# Patient Record
Sex: Female | Born: 1940 | Race: Black or African American | Hispanic: No | Marital: Single | Smoking: Never smoker
Health system: Southern US, Community
[De-identification: ages and names within clinical notes are randomized; demographics above are authoritative.]

## PROBLEM LIST (undated history)

## (undated) DIAGNOSIS — I2510 Atherosclerotic heart disease of native coronary artery without angina pectoris: Secondary | ICD-10-CM

## (undated) DIAGNOSIS — I4719 Other supraventricular tachycardia: Secondary | ICD-10-CM

## (undated) DIAGNOSIS — I509 Heart failure, unspecified: Secondary | ICD-10-CM

## (undated) DIAGNOSIS — N183 Chronic kidney disease, stage 3 unspecified: Secondary | ICD-10-CM

## (undated) DIAGNOSIS — K6289 Other specified diseases of anus and rectum: Secondary | ICD-10-CM

## (undated) DIAGNOSIS — I10 Essential (primary) hypertension: Secondary | ICD-10-CM

## (undated) DIAGNOSIS — D125 Benign neoplasm of sigmoid colon: Secondary | ICD-10-CM

## (undated) DIAGNOSIS — E7800 Pure hypercholesterolemia, unspecified: Secondary | ICD-10-CM

## (undated) DIAGNOSIS — D122 Benign neoplasm of ascending colon: Secondary | ICD-10-CM

## (undated) DIAGNOSIS — I471 Supraventricular tachycardia: Secondary | ICD-10-CM

## (undated) DIAGNOSIS — D123 Benign neoplasm of transverse colon: Secondary | ICD-10-CM

## (undated) DIAGNOSIS — E119 Type 2 diabetes mellitus without complications: Secondary | ICD-10-CM

## (undated) DIAGNOSIS — R0681 Apnea, not elsewhere classified: Secondary | ICD-10-CM

## (undated) HISTORY — DX: Other specified diseases of anus and rectum: K62.89

## (undated) HISTORY — PX: ABDOMINAL HYSTERECTOMY: SHX81

## (undated) HISTORY — DX: Benign neoplasm of sigmoid colon: D12.5

## (undated) HISTORY — PX: PACEMAKER IMPLANT: EP1218

## (undated) HISTORY — DX: Benign neoplasm of ascending colon: D12.2

## (undated) HISTORY — DX: Benign neoplasm of transverse colon: D12.3

---

## 2017-07-26 ENCOUNTER — Encounter (HOSPITAL_COMMUNITY): Payer: Self-pay | Admitting: Emergency Medicine

## 2017-07-26 ENCOUNTER — Emergency Department (HOSPITAL_COMMUNITY): Payer: Medicare Other

## 2017-07-26 ENCOUNTER — Other Ambulatory Visit: Payer: Self-pay

## 2017-07-26 DIAGNOSIS — I10 Essential (primary) hypertension: Secondary | ICD-10-CM | POA: Diagnosis not present

## 2017-07-26 DIAGNOSIS — J189 Pneumonia, unspecified organism: Secondary | ICD-10-CM | POA: Insufficient documentation

## 2017-07-26 DIAGNOSIS — E119 Type 2 diabetes mellitus without complications: Secondary | ICD-10-CM | POA: Diagnosis not present

## 2017-07-26 DIAGNOSIS — R05 Cough: Secondary | ICD-10-CM | POA: Diagnosis present

## 2017-07-26 LAB — CBC
HCT: 38.9 % (ref 36.0–46.0)
HEMOGLOBIN: 12.7 g/dL (ref 12.0–15.0)
MCH: 27.4 pg (ref 26.0–34.0)
MCHC: 32.6 g/dL (ref 30.0–36.0)
MCV: 84 fL (ref 78.0–100.0)
PLATELETS: 194 10*3/uL (ref 150–400)
RBC: 4.63 MIL/uL (ref 3.87–5.11)
RDW: 17 % — AB (ref 11.5–15.5)
WBC: 11.7 10*3/uL — ABNORMAL HIGH (ref 4.0–10.5)

## 2017-07-26 LAB — I-STAT TROPONIN, ED: TROPONIN I, POC: 0 ng/mL (ref 0.00–0.08)

## 2017-07-26 LAB — BASIC METABOLIC PANEL
Anion gap: 9 (ref 5–15)
BUN: 23 mg/dL — AB (ref 6–20)
CALCIUM: 9.1 mg/dL (ref 8.9–10.3)
CO2: 26 mmol/L (ref 22–32)
CREATININE: 1.41 mg/dL — AB (ref 0.44–1.00)
Chloride: 102 mmol/L (ref 101–111)
GFR calc Af Amer: 41 mL/min — ABNORMAL LOW (ref >60)
GFR, EST NON AFRICAN AMERICAN: 35 mL/min — AB (ref >60)
GLUCOSE: 151 mg/dL — AB (ref 65–99)
Potassium: 3.5 mmol/L (ref 3.5–5.1)
Sodium: 137 mmol/L (ref 135–145)

## 2017-07-26 NOTE — ED Triage Notes (Signed)
Reports URI symptoms since yesterday along with productive cough.  Also c/o chest pain across center of chest.

## 2017-07-27 ENCOUNTER — Emergency Department (HOSPITAL_COMMUNITY)
Admission: EM | Admit: 2017-07-27 | Discharge: 2017-07-27 | Disposition: A | Discharge status: Home / Self Care | Payer: Medicare Other | Attending: Emergency Medicine | Admitting: Emergency Medicine

## 2017-07-27 DIAGNOSIS — J189 Pneumonia, unspecified organism: Secondary | ICD-10-CM | POA: Diagnosis not present

## 2017-07-27 HISTORY — DX: Essential (primary) hypertension: I10

## 2017-07-27 HISTORY — DX: Apnea, not elsewhere classified: R06.81

## 2017-07-27 HISTORY — DX: Pure hypercholesterolemia, unspecified: E78.00

## 2017-07-27 HISTORY — DX: Type 2 diabetes mellitus without complications: E11.9

## 2017-07-27 LAB — INFLUENZA PANEL BY PCR (TYPE A & B)
INFLAPCR: NEGATIVE
Influenza B By PCR: NEGATIVE

## 2017-07-27 MED ORDER — BENZONATATE 100 MG PO CAPS: 100.0000 mg | ORAL_CAPSULE | Freq: Three times a day (TID) | ORAL | 0 refills | Status: DC

## 2017-07-27 MED ORDER — LEVOFLOXACIN 500 MG PO TABS: 500.0000 mg | ORAL_TABLET | Freq: Every day | ORAL | 0 refills | Status: DC

## 2017-07-27 MED ORDER — ACETAMINOPHEN 500 MG PO TABS
1000.0000 mg | ORAL_TABLET | Freq: Once | ORAL | Status: AC
  Administered 2017-07-27: 1000 mg via ORAL
  Filled 2017-07-27: qty 2

## 2017-07-27 MED ORDER — LEVOFLOXACIN 500 MG PO TABS
500.0000 mg | ORAL_TABLET | Freq: Once | ORAL | Status: AC
  Administered 2017-07-27: 500 mg via ORAL
  Filled 2017-07-27: qty 1

## 2017-07-27 NOTE — ED Provider Notes (Signed)
[facility] EMERGENCY DEPARTMENT Provider Note   CSN: [id number] Arrival date & time: 07/26/17  2145     History   Chief Complaint Chief Complaint  Patient presents with  . URI  . Chest Pain    HPI Candice Lynn is a 76 y.o. female.  HPI Patient is a 76 year old female presents to the emergency department with nasal congestion cough and chills over the past 24 hours.  She reports pain across her central chest.  This is been present for most of the day.  Denies pleuritic chest pain.  She does have a history of diabetes.  No documented fever at home.  Cough is productive.  No significant exertional shortness of breath.  Denies orthopnea.  No unilateral leg swelling.  Denies a history of DVT or pulmonary embolism.  No prior history of coronary artery disease.  Symptoms are mild to moderate in severity.   Past Medical History:  Diagnosis Date  . Apnea   . Diabetes mellitus without complication ([place])   . Hypercholesterolemia   . Hypertension     There are no active problems to display for this patient.   Past Surgical History:  Procedure Laterality Date  . PACEMAKER IMPLANT      OB History    No data available       Home Medications    Prior to Admission medications   Medication Sig Start Date End Date Taking? Authorizing Provider  benzonatate (TESSALON) 100 MG capsule Take 1 capsule (100 mg total) by mouth every 8 (eight) hours. 07/27/17   Jola Schmidt, MD  levofloxacin (LEVAQUIN) 500 MG tablet Take 1 tablet (500 mg total) by mouth daily. 07/27/17   Jola Schmidt, MD    Family History No family history on file.  Social History Social History   Tobacco Use  . Smoking status: Never Smoker  . Smokeless tobacco: Never Used  Substance Use Topics  . Alcohol use: No    Frequency: Never  . Drug use: No     Allergies   Patient has no known allergies.   Review of Systems Review of Systems  All other systems reviewed and are  negative.    Physical Exam Updated Vital Signs BP 125/60   Pulse 78   Temp 99 F (37.2 C) (Oral)   Resp (!) 23   Ht 5' (1.524 m)   Wt 89.8 kg (198 lb)   SpO2 98%   BMI 38.67 kg/m   Physical Exam  Constitutional: She is oriented to person, place, and time. She appears well-developed and well-nourished. No distress.  HENT:  Head: Normocephalic and atraumatic.  Eyes: EOM are normal.  Neck: Normal range of motion.  Cardiovascular: Normal rate, regular rhythm and normal heart sounds.  Pulmonary/Chest: Effort normal and breath sounds normal.  Abdominal: Soft. She exhibits no distension. There is no tenderness.  Musculoskeletal: Normal range of motion.  Neurological: She is alert and oriented to person, place, and time.  Skin: Skin is warm and dry.  Psychiatric: She has a normal mood and affect. Judgment normal.  Nursing note and vitals reviewed.    ED Treatments / Results  Labs (all labs ordered are listed, but only abnormal results are displayed) Labs Reviewed  BASIC METABOLIC PANEL - Abnormal; Notable for the following components:      Result Value   Glucose, Bld 151 (*)    BUN 23 (*)    Creatinine, Ser 1.41 (*)    GFR calc non Af Wyvonnia Lora  35 (*)    GFR calc Af Amer 41 (*)    All other components within normal limits  CBC - Abnormal; Notable for the following components:   WBC 11.7 (*)    RDW 17.0 (*)    All other components within normal limits  INFLUENZA PANEL BY PCR (TYPE A & B)  I-STAT TROPONIN, ED    EKG  EKG Interpretation  Date/Time:  Sunday July 26 2017 22:05:03 EST Ventricular Rate:  90 PR Interval:  200 QRS Duration: 64 QT Interval:  340 QTC Calculation: 415 R Axis:   43 Text Interpretation:  Normal sinus rhythm Cannot rule out Anterior infarct , age undetermined Abnormal ECG No old tracing to compare Confirmed by Karey Stucki ([id number]) on 07/27/2017 5:41:49 AM       Radiology Dg Chest 2 View  Result Date: 07/26/2017 CLINICAL DATA:  76 y/o   F; chest pain.  Cough and congestion. EXAM: CHEST  2 VIEW COMPARISON:  None. FINDINGS: Cardiomegaly in 2 lead pacemaker. Coarse reticular opacities and ill-defined left lower lobe infiltrate. No pleural effusion or pneumothorax. Mild multilevel degenerative changes of the spine. IMPRESSION: Coarse reticular opacities and left lower lobe infiltrate, probably acute bronchopneumonia. Cardiomegaly. Electronically Signed   By: Lance  Furusawa-Stratton M.D.   On: 07/26/2017 22:58    Procedures Procedures (including critical care time)  Medications Ordered in ED Medications  levofloxacin (LEVAQUIN) tablet 500 mg (500 mg Oral Given 07/27/17 0315)  acetaminophen (TYLENOL) tablet 1,000 mg (1,000 mg Oral Given 07/27/17 0308)     Initial Impression / Assessment and Plan / ED Course  I have reviewed the triage vital signs and the nursing notes.  Pertinent labs & imaging results that were available during my care of the patient were reviewed by me and considered in my medical decision making (see chart for details).     Overall well-appearing.  Observed in the emergency department.  Vital signs remained stable.  No hypoxia.  Feels better after Tylenol.  Dose of Levaquin given in the emergency department.  Home with Levaquin.  She understands return to the ER for new or worsening symptoms.  [place].  She has been instructed to follow-up with her primary care physician when she returns home.  Final Clinical Impressions(s) / ED Diagnoses   Final diagnoses:  Community acquired pneumonia, unspecified laterality    ED Discharge Orders        Ordered    levofloxacin (LEVAQUIN) 500 MG tablet  Daily     07/27/17 0537    benzonatate (TESSALON) 100 MG capsule  Every 8 hours     12 /31/18 0537       Jola Schmidt, MD 07/27/17 [phone]

## 2017-07-27 NOTE — ED Notes (Signed)
Pt understood dc material. NAD noted. Scripts given at dc 

## 2019-07-12 ENCOUNTER — Encounter (HOSPITAL_COMMUNITY): Payer: Self-pay

## 2019-07-12 ENCOUNTER — Ambulatory Visit (HOSPITAL_COMMUNITY)
Admission: EM | Admit: 2019-07-12 | Discharge: 2019-07-12 | Disposition: A | Discharge status: Home / Self Care | Payer: Medicare Other | Attending: Family Medicine | Admitting: Family Medicine

## 2019-07-12 ENCOUNTER — Other Ambulatory Visit: Payer: Self-pay

## 2019-07-12 ENCOUNTER — Telehealth: Payer: Self-pay

## 2019-07-12 DIAGNOSIS — R319 Hematuria, unspecified: Secondary | ICD-10-CM | POA: Insufficient documentation

## 2019-07-12 HISTORY — DX: Heart failure, unspecified: I50.9

## 2019-07-12 LAB — POCT URINALYSIS DIP (DEVICE)
Bilirubin Urine: NEGATIVE
Glucose, UA: NEGATIVE mg/dL
Ketones, ur: NEGATIVE mg/dL
Nitrite: NEGATIVE
Protein, ur: 30 mg/dL — AB
Specific Gravity, Urine: 1.015 (ref 1.005–1.030)
Urobilinogen, UA: 1 mg/dL (ref 0.0–1.0)
pH: 7 (ref 5.0–8.0)

## 2019-07-12 MED ORDER — CEPHALEXIN 500 MG PO CAPS
500.0000 mg | ORAL_CAPSULE | Freq: Two times a day (BID) | ORAL | 0 refills | Status: AC
Start: 2019-07-12 — End: 2019-07-19

## 2019-07-12 NOTE — ED Provider Notes (Signed)
[facility]    CSN: [id number] Arrival date & time: 07/12/19  1102      History   Chief Complaint Chief Complaint  Patient presents with  . Hematuria    HPI Candice Lynn is a 78 y.o. female.   Is a 78 year old female with past medical history of CHF, diabetes, high cholesterol, hypertension.  She presents today with approximately 2 weeks of hematuria.  Symptoms have been constant.  She has had some mild lower back pain.  Denies any dysuria, hematuria or urinary frequency.  Denies any flank pain, nausea or vomiting.  No fever. ROS per HPI      Past Medical History:  Diagnosis Date  . Apnea   . CHF (congestive heart failure) ([place])   . Diabetes mellitus without complication ([place])   . Hypercholesterolemia   . Hypertension     There are no problems to display for this patient.   Past Surgical History:  Procedure Laterality Date  . PACEMAKER IMPLANT      OB History   No obstetric history on file.      Home Medications    Prior to Admission medications   Medication Sig Start Date End Date Taking? Authorizing Provider  cephALEXin (KEFLEX) 500 MG capsule Take 1 capsule (500 mg total) by mouth 2 (two) times daily for 7 days. 07/12/19 07/19/19  Orvan July, NP    Family History History reviewed. No pertinent family history.  Social History Social History   Tobacco Use  . Smoking status: Never Smoker  . Smokeless tobacco: Never Used  Substance Use Topics  . Alcohol use: No  . Drug use: No     Allergies   Patient has no known allergies.   Review of Systems Review of Systems   Physical Exam Triage Vital Signs ED Triage Vitals  Enc Vitals Group     BP 07/12/19 1207 (!) 144/84     Pulse Rate 07/12/19 1207 85     Resp 07/12/19 1207 18     Temp 07/12/19 1207 98 F (36.7 C)     Temp Source 07/12/19 1207 Oral     SpO2 07/12/19 1207 100 %     Weight 07/12/19 1208 170 lb (77.1 kg)     Height --      Head Circumference --    Peak Flow --      Pain Score 07/12/19 1208 7     Pain Loc --      Pain Edu? --      Excl. in [place]? --    No data found.  Updated Vital Signs BP (!) 144/84 (BP Location: Right Arm)   Pulse 85   Temp 98 F (36.7 C) (Oral)   Resp 18   Wt 170 lb (77.1 kg)   SpO2 100%   BMI 33.20 kg/m   Visual Acuity Right Eye Distance:   Left Eye Distance:   Bilateral Distance:    Right Eye Near:   Left Eye Near:    Bilateral Near:     Physical Exam Vitals and nursing note reviewed.  Constitutional:      General: She is not in acute distress.    Appearance: Normal appearance. She is not ill-appearing, toxic-appearing or diaphoretic.  HENT:     Head: Normocephalic.     Nose: Nose normal.     Mouth/Throat:     Pharynx: Oropharynx is clear.  Eyes:     Conjunctiva/sclera: Conjunctivae normal.  Pulmonary:  Effort: Pulmonary effort is normal.  Abdominal:     Palpations: Abdomen is soft.     Tenderness: There is no abdominal tenderness.  Musculoskeletal:        General: Normal range of motion.       Arms:     Cervical back: Normal range of motion.     Comments: Mild TTP   Skin:    General: Skin is warm and dry.     Findings: No rash.  Neurological:     Mental Status: She is alert.  Psychiatric:        Mood and Affect: Mood normal.      UC Treatments / Results  Labs (all labs ordered are listed, but only abnormal results are displayed) Labs Reviewed  POCT URINALYSIS DIP (DEVICE) - Abnormal; Notable for the following components:      Result Value   Hgb urine dipstick MODERATE (*)    Protein, ur 30 (*)    Leukocytes,Ua TRACE (*)    All other components within normal limits  URINE CULTURE    EKG   Radiology No results found.  Procedures Procedures (including critical care time)  Medications Ordered in UC Medications - No data to display  Initial Impression / Assessment and Plan / UC Course  I have reviewed the triage vital signs and the nursing  notes.  Pertinent labs & imaging results that were available during my care of the patient were reviewed by me and considered in my medical decision making (see chart for details).     Hematuria-urine with trace leuks and moderate hemoglobin. Will go ahead and treat for urinary tract infection pending culture. Recommended if culture is negative and symptoms remain she will need to follow-up with her primary care and possible urology consult.  Final Clinical Impressions(s) / UC Diagnoses   Final diagnoses:  Hematuria, unspecified type     Discharge Instructions     Treating you for a urinary tract infection. Take the medication as prescribed.  Follow up as needed for continued or worsening symptoms     ED Prescriptions    Medication Sig Dispense Auth. Provider   cephALEXin (KEFLEX) 500 MG capsule Take 1 capsule (500 mg total) by mouth 2 (two) times daily for 7 days. 14 capsule Bradley Bostelman A, NP     PDMP not reviewed this encounter.   Orvan July, NP 07/13/19 [phone]

## 2019-07-12 NOTE — ED Triage Notes (Signed)
Pt states she has been seeing blood in her urine for 2 weeks or more.

## 2019-07-12 NOTE — Telephone Encounter (Signed)
NOTES ON FILE FROM [facility] [phone] , PATIENT ALREADY HAS APPT

## 2019-07-12 NOTE — Discharge Instructions (Addendum)
Treating you for a urinary tract infection. Take the medication as prescribed.  Follow up as needed for continued or worsening symptoms

## 2019-07-13 LAB — URINE CULTURE

## 2019-07-14 ENCOUNTER — Telehealth: Payer: Self-pay

## 2019-07-14 NOTE — Telephone Encounter (Signed)
NOTES ON FILE FROM [facility] [phone]

## 2019-07-29 DIAGNOSIS — K922 Gastrointestinal hemorrhage, unspecified: Secondary | ICD-10-CM

## 2019-07-29 HISTORY — DX: Gastrointestinal hemorrhage, unspecified: K92.2

## 2019-08-09 ENCOUNTER — Inpatient Hospital Stay (HOSPITAL_COMMUNITY)
Admission: EM | Admit: 2019-08-09 | Discharge: 2019-08-16 | DRG: 374 | Disposition: A | Discharge status: Home / Self Care | Payer: Medicare Other | Attending: Internal Medicine | Admitting: Internal Medicine

## 2019-08-09 ENCOUNTER — Other Ambulatory Visit: Payer: Self-pay

## 2019-08-09 ENCOUNTER — Encounter (HOSPITAL_COMMUNITY): Payer: Self-pay | Admitting: Emergency Medicine

## 2019-08-09 DIAGNOSIS — Z79899 Other long term (current) drug therapy: Secondary | ICD-10-CM

## 2019-08-09 DIAGNOSIS — K922 Gastrointestinal hemorrhage, unspecified: Secondary | ICD-10-CM

## 2019-08-09 DIAGNOSIS — Z950 Presence of cardiac pacemaker: Secondary | ICD-10-CM

## 2019-08-09 DIAGNOSIS — I509 Heart failure, unspecified: Secondary | ICD-10-CM

## 2019-08-09 DIAGNOSIS — D62 Acute posthemorrhagic anemia: Secondary | ICD-10-CM | POA: Diagnosis present

## 2019-08-09 DIAGNOSIS — D630 Anemia in neoplastic disease: Secondary | ICD-10-CM | POA: Diagnosis present

## 2019-08-09 DIAGNOSIS — E669 Obesity, unspecified: Secondary | ICD-10-CM | POA: Diagnosis present

## 2019-08-09 DIAGNOSIS — C218 Malignant neoplasm of overlapping sites of rectum, anus and anal canal: Secondary | ICD-10-CM | POA: Diagnosis not present

## 2019-08-09 DIAGNOSIS — I4891 Unspecified atrial fibrillation: Secondary | ICD-10-CM | POA: Diagnosis not present

## 2019-08-09 DIAGNOSIS — I10 Essential (primary) hypertension: Secondary | ICD-10-CM | POA: Diagnosis present

## 2019-08-09 DIAGNOSIS — R319 Hematuria, unspecified: Secondary | ICD-10-CM | POA: Diagnosis present

## 2019-08-09 DIAGNOSIS — D125 Benign neoplasm of sigmoid colon: Secondary | ICD-10-CM | POA: Diagnosis present

## 2019-08-09 DIAGNOSIS — N183 Chronic kidney disease, stage 3 unspecified: Secondary | ICD-10-CM | POA: Diagnosis present

## 2019-08-09 DIAGNOSIS — I471 Supraventricular tachycardia: Secondary | ICD-10-CM | POA: Diagnosis present

## 2019-08-09 DIAGNOSIS — N179 Acute kidney failure, unspecified: Secondary | ICD-10-CM | POA: Diagnosis present

## 2019-08-09 DIAGNOSIS — N1831 Chronic kidney disease, stage 3a: Secondary | ICD-10-CM | POA: Diagnosis present

## 2019-08-09 DIAGNOSIS — K921 Melena: Secondary | ICD-10-CM | POA: Diagnosis present

## 2019-08-09 DIAGNOSIS — K644 Residual hemorrhoidal skin tags: Secondary | ICD-10-CM | POA: Diagnosis present

## 2019-08-09 DIAGNOSIS — Z6834 Body mass index (BMI) 34.0-34.9, adult: Secondary | ICD-10-CM

## 2019-08-09 DIAGNOSIS — Z91013 Allergy to seafood: Secondary | ICD-10-CM

## 2019-08-09 DIAGNOSIS — Z20822 Contact with and (suspected) exposure to covid-19: Secondary | ICD-10-CM | POA: Diagnosis present

## 2019-08-09 DIAGNOSIS — N281 Cyst of kidney, acquired: Secondary | ICD-10-CM | POA: Diagnosis present

## 2019-08-09 DIAGNOSIS — Z90710 Acquired absence of both cervix and uterus: Secondary | ICD-10-CM

## 2019-08-09 DIAGNOSIS — D696 Thrombocytopenia, unspecified: Secondary | ICD-10-CM | POA: Diagnosis not present

## 2019-08-09 DIAGNOSIS — I130 Hypertensive heart and chronic kidney disease with heart failure and stage 1 through stage 4 chronic kidney disease, or unspecified chronic kidney disease: Secondary | ICD-10-CM | POA: Diagnosis present

## 2019-08-09 DIAGNOSIS — I2510 Atherosclerotic heart disease of native coronary artery without angina pectoris: Secondary | ICD-10-CM | POA: Diagnosis present

## 2019-08-09 DIAGNOSIS — Z800 Family history of malignant neoplasm of digestive organs: Secondary | ICD-10-CM

## 2019-08-09 DIAGNOSIS — E875 Hyperkalemia: Secondary | ICD-10-CM | POA: Diagnosis present

## 2019-08-09 DIAGNOSIS — N3001 Acute cystitis with hematuria: Secondary | ICD-10-CM | POA: Diagnosis present

## 2019-08-09 DIAGNOSIS — K6289 Other specified diseases of anus and rectum: Secondary | ICD-10-CM

## 2019-08-09 DIAGNOSIS — E785 Hyperlipidemia, unspecified: Secondary | ICD-10-CM | POA: Diagnosis present

## 2019-08-09 DIAGNOSIS — E860 Dehydration: Secondary | ICD-10-CM | POA: Diagnosis present

## 2019-08-09 DIAGNOSIS — E042 Nontoxic multinodular goiter: Secondary | ICD-10-CM | POA: Diagnosis present

## 2019-08-09 DIAGNOSIS — D123 Benign neoplasm of transverse colon: Secondary | ICD-10-CM | POA: Diagnosis present

## 2019-08-09 DIAGNOSIS — E119 Type 2 diabetes mellitus without complications: Secondary | ICD-10-CM

## 2019-08-09 DIAGNOSIS — D122 Benign neoplasm of ascending colon: Secondary | ICD-10-CM | POA: Diagnosis present

## 2019-08-09 DIAGNOSIS — I5042 Chronic combined systolic (congestive) and diastolic (congestive) heart failure: Secondary | ICD-10-CM | POA: Diagnosis present

## 2019-08-09 DIAGNOSIS — N3000 Acute cystitis without hematuria: Secondary | ICD-10-CM

## 2019-08-09 DIAGNOSIS — K5731 Diverticulosis of large intestine without perforation or abscess with bleeding: Secondary | ICD-10-CM | POA: Diagnosis present

## 2019-08-09 DIAGNOSIS — K635 Polyp of colon: Secondary | ICD-10-CM

## 2019-08-09 DIAGNOSIS — E7800 Pure hypercholesterolemia, unspecified: Secondary | ICD-10-CM | POA: Diagnosis present

## 2019-08-09 DIAGNOSIS — Z7982 Long term (current) use of aspirin: Secondary | ICD-10-CM

## 2019-08-09 DIAGNOSIS — I340 Nonrheumatic mitral (valve) insufficiency: Secondary | ICD-10-CM | POA: Diagnosis present

## 2019-08-09 DIAGNOSIS — E1122 Type 2 diabetes mellitus with diabetic chronic kidney disease: Secondary | ICD-10-CM | POA: Diagnosis present

## 2019-08-09 DIAGNOSIS — K5790 Diverticulosis of intestine, part unspecified, without perforation or abscess without bleeding: Secondary | ICD-10-CM

## 2019-08-09 HISTORY — DX: Atherosclerotic heart disease of native coronary artery without angina pectoris: I25.10

## 2019-08-09 HISTORY — DX: Other supraventricular tachycardia: I47.19

## 2019-08-09 HISTORY — DX: Supraventricular tachycardia: I47.1

## 2019-08-09 HISTORY — DX: Chronic kidney disease, stage 3 unspecified: N18.30

## 2019-08-09 LAB — COMPREHENSIVE METABOLIC PANEL
ALT: 16 U/L (ref 0–44)
AST: 23 U/L (ref 15–41)
Albumin: 3.3 g/dL — ABNORMAL LOW (ref 3.5–5.0)
Alkaline Phosphatase: 87 U/L (ref 38–126)
Anion gap: 7 (ref 5–15)
BUN: 33 mg/dL — ABNORMAL HIGH (ref 8–23)
CO2: 28 mmol/L (ref 22–32)
Calcium: 9.2 mg/dL (ref 8.9–10.3)
Chloride: 107 mmol/L (ref 98–111)
Creatinine, Ser: 1.71 mg/dL — ABNORMAL HIGH (ref 0.44–1.00)
GFR calc Af Amer: 33 mL/min — ABNORMAL LOW (ref >60)
GFR calc non Af Amer: 28 mL/min — ABNORMAL LOW (ref >60)
Glucose, Bld: 145 mg/dL — ABNORMAL HIGH (ref 70–99)
Potassium: 4 mmol/L (ref 3.5–5.1)
Sodium: 142 mmol/L (ref 135–145)
Total Bilirubin: 0.6 mg/dL (ref 0.3–1.2)
Total Protein: 7.1 g/dL (ref 6.5–8.1)

## 2019-08-09 LAB — CBC
HCT: 36.6 % (ref 36.0–46.0)
HCT: 57.5 % — ABNORMAL HIGH (ref 36.0–46.0)
HCT: 31.4 % — ABNORMAL LOW (ref 36.0–46.0)
Hemoglobin: 11.8 g/dL — ABNORMAL LOW (ref 12.0–15.0)
Hemoglobin: 18.9 g/dL — ABNORMAL HIGH (ref 12.0–15.0)
Hemoglobin: 10.2 g/dL — ABNORMAL LOW (ref 12.0–15.0)
MCH: 29.5 pg (ref 26.0–34.0)
MCH: 29.3 pg (ref 26.0–34.0)
MCH: 29.6 pg (ref 26.0–34.0)
MCHC: 32.2 g/dL (ref 30.0–36.0)
MCHC: 32.9 g/dL (ref 30.0–36.0)
MCHC: 32.5 g/dL (ref 30.0–36.0)
MCV: 91.5 fL (ref 80.0–100.0)
MCV: 89.3 fL (ref 80.0–100.0)
MCV: 91 fL (ref 80.0–100.0)
Platelets: 158 10*3/uL (ref 150–400)
Platelets: 141 10*3/uL — ABNORMAL LOW (ref 150–400)
RBC: 4 MIL/uL (ref 3.87–5.11)
RBC: 6.44 MIL/uL — ABNORMAL HIGH (ref 3.87–5.11)
RBC: 3.45 MIL/uL — ABNORMAL LOW (ref 3.87–5.11)
RDW: 16.8 % — ABNORMAL HIGH (ref 11.5–15.5)
RDW: 18 % — ABNORMAL HIGH (ref 11.5–15.5)
RDW: 16.5 % — ABNORMAL HIGH (ref 11.5–15.5)
WBC: 6.7 10*3/uL (ref 4.0–10.5)
WBC: 6.3 10*3/uL (ref 4.0–10.5)
WBC: 6.1 10*3/uL (ref 4.0–10.5)
nRBC: 0 % (ref 0.0–0.2)
nRBC: 0 % (ref 0.0–0.2)
nRBC: 0 % (ref 0.0–0.2)

## 2019-08-09 LAB — ABO/RH: ABO/RH(D): A POS

## 2019-08-09 LAB — TYPE AND SCREEN
ABO/RH(D): A POS
Antibody Screen: NEGATIVE

## 2019-08-09 LAB — URINALYSIS, ROUTINE W REFLEX MICROSCOPIC
Bilirubin Urine: NEGATIVE
Glucose, UA: NEGATIVE mg/dL
Ketones, ur: NEGATIVE mg/dL
Nitrite: NEGATIVE
Protein, ur: 100 mg/dL — AB
Specific Gravity, Urine: 1.011 (ref 1.005–1.030)
pH: 5 (ref 5.0–8.0)

## 2019-08-09 LAB — POC OCCULT BLOOD, ED: Fecal Occult Bld: POSITIVE — AB

## 2019-08-09 MED ORDER — SODIUM CHLORIDE 0.9 % IV BOLUS
500.0000 mL | Freq: Once | INTRAVENOUS | Status: AC
  Administered 2019-08-09: 500 mL via INTRAVENOUS

## 2019-08-09 MED ORDER — SODIUM CHLORIDE 0.9 % IV SOLN
1.0000 g | Freq: Once | INTRAVENOUS | Status: AC
  Administered 2019-08-09: 1 g via INTRAVENOUS
  Filled 2019-08-09: qty 10

## 2019-08-09 NOTE — ED Notes (Signed)
IV team consulted for difficult start reason.

## 2019-08-09 NOTE — ED Provider Notes (Signed)
[facility] EMERGENCY DEPARTMENT Provider Note   CSN: [id number] Arrival date & time: 08/09/19  1258     History Chief Complaint  Patient presents with  . GI Bleeding    Candice Lynn is a 79 y.o. female  With PMHx HTN, hypercholesterolemia, diabetes, CHF who presents to the ED today with complaint of possible GI bleeding x 1 week. Pt reports that she noticed bright red blood in the toilet bowl approximately 1 week ago - she is unsure if it is coming from her bowels or from her urine. Pt states this morning she noticed large blood clots which concerned her. She denies abdominal pain, chest pain, shortness of breath, nausea, vomiting, dizziness, lightheadedness, vision changes, or any other associated symptoms. She believes her last colonoscopy was ~ 10 years ago but she is uncertain. She denies hx of hemorrhoids.   The history is provided by the patient.       Past Medical History:  Diagnosis Date  . Apnea   . CHF (congestive heart failure) ([place])   . Diabetes mellitus without complication ([place])   . Hypercholesterolemia   . Hypertension     There are no problems to display for this patient.   Past Surgical History:  Procedure Laterality Date  . PACEMAKER IMPLANT       OB History   No obstetric history on file.     No family history on file.  Social History   Tobacco Use  . Smoking status: Never Smoker  . Smokeless tobacco: Never Used  Substance Use Topics  . Alcohol use: No  . Drug use: No    Home Medications Prior to Admission medications   Medication Sig Start Date End Date Taking? Authorizing Provider  acetaminophen (TYLENOL) 500 MG tablet Take 500 mg by mouth every 6 (six) hours as needed for mild pain.   Yes [provider]  aspirin EC 81 MG tablet Take 81 mg by mouth daily.   Yes [provider]  atorvastatin (LIPITOR) 20 MG tablet Take 20 mg by mouth at bedtime.   Yes [provider]  CARTIA XT 180 MG  24 hr capsule Take 180 mg by mouth daily. 08/08/19  Yes [provider]  furosemide (LASIX) 40 MG tablet Take 40 mg by mouth 2 (two) times daily.   Yes [provider]    Allergies    Shellfish allergy  Review of Systems   Review of Systems  Eyes: Negative for visual disturbance.  Cardiovascular: Negative for chest pain.  Gastrointestinal: Negative for abdominal pain, nausea and vomiting.  Genitourinary:       + bright red blood in toilet bowl  Neurological: Negative for dizziness, light-headedness and headaches.  All other systems reviewed and are negative.   Physical Exam Updated Vital Signs BP 122/68 (BP Location: Right Arm)   Pulse 66   Temp 98 F (36.7 C) (Oral)   Resp 18   SpO2 100%   Physical Exam Vitals and nursing note reviewed.  Constitutional:      Appearance: She is not ill-appearing or diaphoretic.  HENT:     Head: Normocephalic and atraumatic.  Eyes:     Conjunctiva/sclera: Conjunctivae normal.  Cardiovascular:     Rate and Rhythm: Normal rate and regular rhythm.     Pulses: Normal pulses.  Pulmonary:     Effort: Pulmonary effort is normal.     Breath sounds: Normal breath sounds. No wheezing, rhonchi or rales.  Abdominal:  Palpations: Abdomen is soft.     Tenderness: There is no abdominal tenderness. There is no right CVA tenderness, left CVA tenderness, guarding or rebound.  Genitourinary:    Rectum: Guaiac result positive.     Comments: Chaperone present for rectal exam. Small nonthrombosed external hemorrhoid noted on exam. Bright red blood noted in depends underwear/coming from rectum with bright red blood on gloved finger.  Musculoskeletal:     Cervical back: Neck supple.  Skin:    General: Skin is warm and dry.  Neurological:     Mental Status: She is alert.     ED Results / Procedures / Treatments   Labs (all labs ordered are listed, but only abnormal results are displayed) Labs Reviewed  COMPREHENSIVE METABOLIC  PANEL - Abnormal; Notable for the following components:      Result Value   Glucose, Bld 145 (*)    BUN 33 (*)    Creatinine, Ser 1.71 (*)    Albumin 3.3 (*)    GFR calc non Af Amer 28 (*)    GFR calc Af Amer 33 (*)    All other components within normal limits  CBC - Abnormal; Notable for the following components:   Hemoglobin 11.8 (*)    RDW 16.8 (*)    All other components within normal limits  URINALYSIS, ROUTINE W REFLEX MICROSCOPIC - Abnormal; Notable for the following components:   Hgb urine dipstick LARGE (*)    Protein, ur 100 (*)    Leukocytes,Ua TRACE (*)    Bacteria, UA RARE (*)    All other components within normal limits  CBC - Abnormal; Notable for the following components:   RBC 6.44 (*)    Hemoglobin 18.9 (*)    HCT 57.5 (*)    RDW 18.0 (*)    All other components within normal limits  CBC - Abnormal; Notable for the following components:   RBC 3.45 (*)    Hemoglobin 10.2 (*)    HCT 31.4 (*)    RDW 16.5 (*)    Platelets 141 (*)    All other components within normal limits  POC OCCULT BLOOD, ED - Abnormal; Notable for the following components:   Fecal Occult Bld POSITIVE (*)    All other components within normal limits  SARS CORONAVIRUS 2 (TAT 6-24 HRS)  URINE CULTURE  TYPE AND SCREEN  ABO/RH    EKG None  Radiology No results found.  Procedures Procedures (including critical care time)  Medications Ordered in ED Medications  sodium chloride 0.9 % bolus 500 mL (0 mLs Intravenous Stopped 08/09/19 2239)  cefTRIAXone (ROCEPHIN) 1 g in sodium chloride 0.9 % 100 mL IVPB (1 g Intravenous New Bag/Given 08/09/19 2319)    ED Course  I have reviewed the triage vital signs and the nursing notes.  Pertinent labs & imaging results that were available during my care of the patient were reviewed by me and considered in my medical decision making (see chart for details).  79 year old female presents the ED today complaining of blood from unknown source for the  past several days.  She passed bright red blood clots today which concerned her.  On arrival to the ED patient's vitals are stable.  She is afebrile without tachycardia or tachypnea.  Blood pressure is normotensive.  No other complaints at this time besides urinary frequency.  Question if her blood is from her urine.  On exam patient evaluated, she has blood in her depends.  It does appear that  it is coming from her rectum.  Bright red blood on gloved finger during rectal exam with one small external nonthrombosed hemorrhoid.  Patient unknown if she has internal hemorrhoids.  Guaiac positive today.  CBC with a hemoglobin of 11.8.  This was drawn several hours ago as patient has been in the waiting room for about 6 hours before being seen.  Will repeat.  If any drop in hemoglobin feel that patient would benefit from observation with possible GI involvement with colonoscopy in the morning.   Creatinine mildly elevated at 1.71 although patient's baseline 1.4.  Will give 500 cc fluids.  Urinalysis with trace leuks, 11-20 red blood cells, 6-10 white blood cells, rare bacteria.  Feel that this specimen is likely contaminated given the fact the patient is on a pure wick and is picking up blood that way even 5 the patient is complaining of some frequency will treat with Rocephin in the ED and discharged home with Keflex.  Urine culture sent.   CBC with hemoglobin of 18.9; feel that this specimen is likely hemolyzed. Will redraw.   Repeat CBC with Hgb 10.2 given this has dropped more than a full point while in the ED feel pt would benefit from admission and GI workup. Will consult GI and medicine at this time.   Clinical Course as of Aug 09 14  Tue Aug 09, 2019  1900 Fecal Occult Blood, POC(!): POSITIVE [MV]  1900 Hemoglobin(!): 11.8 [MV]  2337 Hemoglobin(!): 10.2 [MV]  2355 Discussed case with GI Dr. Carlean Purl who will follow patient and have partners evaluate her tomorrow. He recommends clear liquid diet; plan  for colonoscopy while in the hospital   [MV]    Clinical Course User Index [MV] Eustaquio Maize, PA-C   12:15 AM At shift change case signed out to Nuala Alpha, [place], who will place consult to medicine for admission.   This note was prepared using Dragon voice recognition software and may include unintentional dictation errors due to the inherent limitations of voice recognition software.  MDM Rules/Calculators/A&P                       Final Clinical Impression(s) / ED Diagnoses Final diagnoses:  Lower GI bleed  Acute cystitis without hematuria    Rx / DC Orders ED Discharge Orders    None       Eustaquio Maize, PA-C 08/10/19 0016    Tegeler, Gwenyth Allegra, MD 08/12/19 [phone]

## 2019-08-09 NOTE — ED Triage Notes (Signed)
Pt states she used the restroom this morning and after wards she saw blood clots and bright red blood. Pt states she is unsure if this came from her BM or urine. Pt denies any pain, pt has no complaints. denies any blood thinner.

## 2019-08-10 ENCOUNTER — Encounter (HOSPITAL_COMMUNITY): Payer: Self-pay | Admitting: Internal Medicine

## 2019-08-10 DIAGNOSIS — I509 Heart failure, unspecified: Secondary | ICD-10-CM

## 2019-08-10 DIAGNOSIS — E119 Type 2 diabetes mellitus without complications: Secondary | ICD-10-CM

## 2019-08-10 DIAGNOSIS — R319 Hematuria, unspecified: Secondary | ICD-10-CM

## 2019-08-10 DIAGNOSIS — K921 Melena: Secondary | ICD-10-CM | POA: Diagnosis present

## 2019-08-10 DIAGNOSIS — N183 Chronic kidney disease, stage 3 unspecified: Secondary | ICD-10-CM | POA: Diagnosis present

## 2019-08-10 DIAGNOSIS — N1831 Chronic kidney disease, stage 3a: Secondary | ICD-10-CM | POA: Diagnosis present

## 2019-08-10 DIAGNOSIS — E875 Hyperkalemia: Secondary | ICD-10-CM | POA: Diagnosis present

## 2019-08-10 DIAGNOSIS — I5030 Unspecified diastolic (congestive) heart failure: Secondary | ICD-10-CM | POA: Diagnosis not present

## 2019-08-10 DIAGNOSIS — Z800 Family history of malignant neoplasm of digestive organs: Secondary | ICD-10-CM | POA: Diagnosis not present

## 2019-08-10 DIAGNOSIS — N281 Cyst of kidney, acquired: Secondary | ICD-10-CM | POA: Diagnosis present

## 2019-08-10 DIAGNOSIS — I5042 Chronic combined systolic (congestive) and diastolic (congestive) heart failure: Secondary | ICD-10-CM | POA: Diagnosis present

## 2019-08-10 DIAGNOSIS — I2510 Atherosclerotic heart disease of native coronary artery without angina pectoris: Secondary | ICD-10-CM | POA: Insufficient documentation

## 2019-08-10 DIAGNOSIS — I10 Essential (primary) hypertension: Secondary | ICD-10-CM | POA: Diagnosis present

## 2019-08-10 DIAGNOSIS — D62 Acute posthemorrhagic anemia: Secondary | ICD-10-CM | POA: Diagnosis present

## 2019-08-10 DIAGNOSIS — Z79899 Other long term (current) drug therapy: Secondary | ICD-10-CM | POA: Diagnosis not present

## 2019-08-10 DIAGNOSIS — K644 Residual hemorrhoidal skin tags: Secondary | ICD-10-CM | POA: Diagnosis present

## 2019-08-10 DIAGNOSIS — E1129 Type 2 diabetes mellitus with other diabetic kidney complication: Secondary | ICD-10-CM

## 2019-08-10 DIAGNOSIS — K922 Gastrointestinal hemorrhage, unspecified: Secondary | ICD-10-CM

## 2019-08-10 DIAGNOSIS — E1122 Type 2 diabetes mellitus with diabetic chronic kidney disease: Secondary | ICD-10-CM | POA: Diagnosis present

## 2019-08-10 DIAGNOSIS — E7800 Pure hypercholesterolemia, unspecified: Secondary | ICD-10-CM | POA: Diagnosis present

## 2019-08-10 DIAGNOSIS — N3000 Acute cystitis without hematuria: Secondary | ICD-10-CM | POA: Diagnosis not present

## 2019-08-10 DIAGNOSIS — C20 Malignant neoplasm of rectum: Secondary | ICD-10-CM | POA: Diagnosis not present

## 2019-08-10 DIAGNOSIS — Z950 Presence of cardiac pacemaker: Secondary | ICD-10-CM | POA: Diagnosis not present

## 2019-08-10 DIAGNOSIS — K5731 Diverticulosis of large intestine without perforation or abscess with bleeding: Secondary | ICD-10-CM | POA: Diagnosis present

## 2019-08-10 DIAGNOSIS — I471 Supraventricular tachycardia: Secondary | ICD-10-CM | POA: Diagnosis present

## 2019-08-10 DIAGNOSIS — I4719 Other supraventricular tachycardia: Secondary | ICD-10-CM | POA: Insufficient documentation

## 2019-08-10 DIAGNOSIS — I340 Nonrheumatic mitral (valve) insufficiency: Secondary | ICD-10-CM | POA: Diagnosis present

## 2019-08-10 DIAGNOSIS — K6289 Other specified diseases of anus and rectum: Secondary | ICD-10-CM | POA: Diagnosis not present

## 2019-08-10 DIAGNOSIS — N179 Acute kidney failure, unspecified: Secondary | ICD-10-CM | POA: Diagnosis present

## 2019-08-10 DIAGNOSIS — C218 Malignant neoplasm of overlapping sites of rectum, anus and anal canal: Secondary | ICD-10-CM | POA: Diagnosis present

## 2019-08-10 DIAGNOSIS — R809 Proteinuria, unspecified: Secondary | ICD-10-CM

## 2019-08-10 DIAGNOSIS — Z794 Long term (current) use of insulin: Secondary | ICD-10-CM

## 2019-08-10 DIAGNOSIS — E042 Nontoxic multinodular goiter: Secondary | ICD-10-CM | POA: Diagnosis present

## 2019-08-10 DIAGNOSIS — K625 Hemorrhage of anus and rectum: Secondary | ICD-10-CM | POA: Diagnosis not present

## 2019-08-10 DIAGNOSIS — Z7982 Long term (current) use of aspirin: Secondary | ICD-10-CM | POA: Diagnosis not present

## 2019-08-10 DIAGNOSIS — I130 Hypertensive heart and chronic kidney disease with heart failure and stage 1 through stage 4 chronic kidney disease, or unspecified chronic kidney disease: Secondary | ICD-10-CM | POA: Diagnosis present

## 2019-08-10 DIAGNOSIS — K635 Polyp of colon: Secondary | ICD-10-CM | POA: Diagnosis not present

## 2019-08-10 DIAGNOSIS — E785 Hyperlipidemia, unspecified: Secondary | ICD-10-CM | POA: Diagnosis present

## 2019-08-10 DIAGNOSIS — Z91013 Allergy to seafood: Secondary | ICD-10-CM | POA: Diagnosis not present

## 2019-08-10 DIAGNOSIS — Z20822 Contact with and (suspected) exposure to covid-19: Secondary | ICD-10-CM | POA: Diagnosis present

## 2019-08-10 DIAGNOSIS — N3001 Acute cystitis with hematuria: Secondary | ICD-10-CM | POA: Diagnosis present

## 2019-08-10 HISTORY — DX: Hematuria, unspecified: R31.9

## 2019-08-10 HISTORY — DX: Essential (primary) hypertension: I10

## 2019-08-10 HISTORY — DX: Melena: K92.1

## 2019-08-10 HISTORY — DX: Gastrointestinal hemorrhage, unspecified: K92.2

## 2019-08-10 HISTORY — DX: Type 2 diabetes mellitus without complications: E11.9

## 2019-08-10 LAB — BASIC METABOLIC PANEL
Anion gap: 7 (ref 5–15)
BUN: 24 mg/dL — ABNORMAL HIGH (ref 8–23)
CO2: 25 mmol/L (ref 22–32)
Calcium: 8.3 mg/dL — ABNORMAL LOW (ref 8.9–10.3)
Chloride: 111 mmol/L (ref 98–111)
Creatinine, Ser: 1.28 mg/dL — ABNORMAL HIGH (ref 0.44–1.00)
GFR calc Af Amer: 47 mL/min — ABNORMAL LOW (ref >60)
GFR calc non Af Amer: 40 mL/min — ABNORMAL LOW (ref >60)
Glucose, Bld: 87 mg/dL (ref 70–99)
Potassium: 3.5 mmol/L (ref 3.5–5.1)
Sodium: 143 mmol/L (ref 135–145)

## 2019-08-10 LAB — CBG MONITORING, ED
Glucose-Capillary: 71 mg/dL (ref 70–99)
Glucose-Capillary: 76 mg/dL (ref 70–99)
Glucose-Capillary: 74 mg/dL (ref 70–99)

## 2019-08-10 LAB — HEMOGLOBIN AND HEMATOCRIT, BLOOD
HCT: 32.2 % — ABNORMAL LOW (ref 36.0–46.0)
HCT: 31.7 % — ABNORMAL LOW (ref 36.0–46.0)
Hemoglobin: 10.3 g/dL — ABNORMAL LOW (ref 12.0–15.0)
Hemoglobin: 9.9 g/dL — ABNORMAL LOW (ref 12.0–15.0)

## 2019-08-10 LAB — HEMOGLOBIN A1C
Hgb A1c MFr Bld: 5.6 % (ref 4.8–5.6)
Mean Plasma Glucose: 114.02 mg/dL

## 2019-08-10 LAB — GLUCOSE, CAPILLARY
Glucose-Capillary: 161 mg/dL — ABNORMAL HIGH (ref 70–99)
Glucose-Capillary: 99 mg/dL (ref 70–99)
Glucose-Capillary: 99 mg/dL (ref 70–99)

## 2019-08-10 LAB — SARS CORONAVIRUS 2 (TAT 6-24 HRS): SARS Coronavirus 2: NEGATIVE

## 2019-08-10 MED ORDER — SODIUM CHLORIDE 0.9 % IV SOLN: 1.0000 g | INTRAVENOUS | Status: DC

## 2019-08-10 MED ORDER — SODIUM CHLORIDE 0.9 % IV SOLN: INTRAVENOUS | Status: DC

## 2019-08-10 MED ORDER — PEG-KCL-NACL-NASULF-NA ASC-C 100 G PO SOLR
0.5000 | Freq: Once | ORAL | Status: DC
  Filled 2019-08-10: qty 1

## 2019-08-10 MED ORDER — INSULIN ASPART 100 UNIT/ML ~~LOC~~ SOLN
0.0000 [IU] | SUBCUTANEOUS | Status: DC
  Administered 2019-08-10: 2 [IU] via SUBCUTANEOUS

## 2019-08-10 MED ORDER — METOCLOPRAMIDE HCL 5 MG/ML IJ SOLN
10.0000 mg | Freq: Once | INTRAMUSCULAR | Status: AC
  Administered 2019-08-11: 10 mg via INTRAVENOUS
  Filled 2019-08-10: qty 2

## 2019-08-10 MED ORDER — ACETAMINOPHEN 325 MG PO TABS: 650.0000 mg | ORAL_TABLET | Freq: Four times a day (QID) | ORAL | Status: DC | PRN

## 2019-08-10 MED ORDER — PEG-KCL-NACL-NASULF-NA ASC-C 100 G PO SOLR
0.5000 | Freq: Once | ORAL | Status: DC
  Filled 2019-08-10 (×2): qty 1

## 2019-08-10 MED ORDER — ACETAMINOPHEN 650 MG RE SUPP: 650.0000 mg | Freq: Four times a day (QID) | RECTAL | Status: DC | PRN

## 2019-08-10 MED ORDER — DILTIAZEM HCL ER COATED BEADS 180 MG PO CP24: 180.0000 mg | ORAL_CAPSULE | Freq: Every day | ORAL | Status: DC

## 2019-08-10 MED ORDER — METOCLOPRAMIDE HCL 5 MG/ML IJ SOLN
10.0000 mg | Freq: Once | INTRAMUSCULAR | Status: AC
  Administered 2019-08-10: 10 mg via INTRAVENOUS
  Filled 2019-08-10: qty 2

## 2019-08-10 MED ORDER — PEG-KCL-NACL-NASULF-NA ASC-C 100 G PO SOLR
0.5000 | Freq: Once | ORAL | Status: AC
  Administered 2019-08-11: 100 g via ORAL
  Filled 2019-08-10: qty 1

## 2019-08-10 MED ORDER — ATORVASTATIN CALCIUM 10 MG PO TABS
20.0000 mg | ORAL_TABLET | Freq: Every day | ORAL | Status: DC
  Administered 2019-08-10 – 2019-08-15 (×6): 20 mg via ORAL
  Filled 2019-08-10 (×6): qty 2

## 2019-08-10 MED ORDER — PEG-KCL-NACL-NASULF-NA ASC-C 100 G PO SOLR
0.5000 | Freq: Once | ORAL | Status: AC
  Administered 2019-08-10: 100 g via ORAL
  Filled 2019-08-10: qty 1

## 2019-08-10 MED ORDER — PEG-KCL-NACL-NASULF-NA ASC-C 100 G PO SOLR: 1.0000 | Freq: Once | ORAL | Status: DC

## 2019-08-10 MED ORDER — BISACODYL 5 MG PO TBEC
20.0000 mg | DELAYED_RELEASE_TABLET | Freq: Once | ORAL | Status: AC
  Administered 2019-08-10: 20 mg via ORAL
  Filled 2019-08-10: qty 4

## 2019-08-10 NOTE — Consult Note (Signed)
Referring Provider:  Dr. Alroy Bailiff, [place] Primary Care Physician:  System, Pcp Not In Primary Gastroenterologist:  Unassigned  Reason for Consultation:  GI bleed  HPI: Candice Lynn is a 79 y.o. female with medical history significant of CHF, diet-controlled type 2 diabetes, hypertension, hyperlipidemia, paroxysmal atrial tachycardia, severe mitral regurgitation, CKD stage III presenting to the ED to be evaluated after she noticed bright red blood in the toilet bowl.  Patient states she has been seeing blood on her pad in her underwear for a week or 2, but was not sure initially where the blood was coming from.  Then yesterday she started noticing bright red blood per rectum with clots in the toilet bowl.  She has never had a gastrointestinal bleed before.  She is not on any blood thinners.  Her chart lists ASA 81 mg daily, but she tells me that she is not taking it.  She denies nausea, vomiting, or abdominal pain.  She thinks that she had a colonoscopy in the past, but says it was probably close to 10 years ago (she is form out of town and is just visiting/staying with family for a little while).  Hemoglobin 11.8-->10.2-->10.3 grams.  Rectal examination done in the ED revealed bright red blood and one small external nonthrombosed hemorrhoid.  FOBT positive.  EDP documented bright red blood noted in depends underwear/coming from rectum with bright red blood on gloved finger.   Also, of note, she was seen in the ED for hematuria in December and has had 2 urinalysis studies now that have shown moderate to large amount of blood/Hgb as well.    Past Medical History:  Diagnosis Date  . Apnea   . CAD (coronary artery disease)    non-obstructive  . CHF (congestive heart failure) ([place])   . CKD (chronic kidney disease), stage III   . Diabetes mellitus without complication ([place])   . Hypercholesterolemia   . Hypertension   . PAT (paroxysmal atrial tachycardia) ([place])     Past Surgical History:    Procedure Laterality Date  . PACEMAKER IMPLANT      Prior to Admission medications   Medication Sig Start Date End Date Taking? Authorizing Provider  acetaminophen (TYLENOL) 500 MG tablet Take 500 mg by mouth every 6 (six) hours as needed for mild pain.   Yes [provider]  aspirin EC 81 MG tablet Take 81 mg by mouth daily.   Yes [provider]  atorvastatin (LIPITOR) 20 MG tablet Take 20 mg by mouth at bedtime.   Yes [provider]  CARTIA XT 180 MG 24 hr capsule Take 180 mg by mouth daily. 08/08/19  Yes [provider]  furosemide (LASIX) 40 MG tablet Take 40 mg by mouth 2 (two) times daily.   Yes [provider]    Current Facility-Administered Medications  Medication Dose Route Frequency Provider Last Rate Last Admin  . 0.9 %  sodium chloride infusion   Intravenous Continuous Shela Leff, MD 100 mL/hr at 08/10/19 0524 Rate Change at 08/10/19 0524  . acetaminophen (TYLENOL) tablet 650 mg  650 mg Oral Q6H PRN Shela Leff, MD       Or  . acetaminophen (TYLENOL) suppository 650 mg  650 mg Rectal Q6H PRN Shela Leff, MD      . atorvastatin (LIPITOR) tablet 20 mg  20 mg Oral QHS Shela Leff, MD      . cefTRIAXone (ROCEPHIN) 1 g in sodium chloride 0.9 % 100 mL IVPB  1  g Intravenous Q24H Shela Leff, MD      . insulin aspart (novoLOG) injection 0-9 Units  0-9 Units Subcutaneous Q4H Shela Leff, MD       Current Outpatient Medications  Medication Sig Dispense Refill  . acetaminophen (TYLENOL) 500 MG tablet Take 500 mg by mouth every 6 (six) hours as needed for mild pain.    Marland Kitchen aspirin EC 81 MG tablet Take 81 mg by mouth daily.    Marland Kitchen atorvastatin (LIPITOR) 20 MG tablet Take 20 mg by mouth at bedtime.    Marland Kitchen CARTIA XT 180 MG 24 hr capsule Take 180 mg by mouth daily.    . furosemide (LASIX) 40 MG tablet Take 40 mg by mouth 2 (two) times daily.      Allergies as of 08/09/2019 - Review Complete 08/09/2019   Allergen Reaction Noted  . Shellfish allergy Itching 08/09/2019    History reviewed. No pertinent family history.  Social History   Socioeconomic History  . Marital status: Single    Spouse name: Not on file  . Number of children: Not on file  . Years of education: Not on file  . Highest education level: Not on file  Occupational History  . Not on file  Tobacco Use  . Smoking status: Never Smoker  . Smokeless tobacco: Never Used  Substance and Sexual Activity  . Alcohol use: No  . Drug use: No  . Sexual activity: Not on file  Other Topics Concern  . Not on file  Social History Narrative  . Not on file   Social Determinants of Health   Financial Resource Strain:   . Difficulty of Paying Living Expenses: Not on file  Food Insecurity:   . Worried About Charity fundraiser in the Last Year: Not on file  . Ran Out of Food in the Last Year: Not on file  Transportation Needs:   . Lack of Transportation (Medical): Not on file  . Lack of Transportation (Non-Medical): Not on file  Physical Activity:   . Days of Exercise per Week: Not on file  . Minutes of Exercise per Session: Not on file  Stress:   . Feeling of Stress : Not on file  Social Connections:   . Frequency of Communication with Friends and Family: Not on file  . Frequency of Social Gatherings with Friends and Family: Not on file  . Attends Religious Services: Not on file  . Active Member of Clubs or Organizations: Not on file  . Attends Archivist Meetings: Not on file  . Marital Status: Not on file  Intimate Partner Violence:   . Fear of Current or Ex-Partner: Not on file  . Emotionally Abused: Not on file  . Physically Abused: Not on file  . Sexually Abused: Not on file    Review of Systems: ROS is O/W negative except as mentioned in HPI.  Physical Exam: Vital signs in last 24 hours: Temp:  [97.6 F (36.4 C)-98 F (36.7 C)] 97.6 F (36.4 C) (01/13 0735) Pulse Rate:  [60-87] 66 (01/13  0735) Resp:  [16-21] 20 (01/13 0735) BP: (122-145)/(60-99) 145/65 (01/13 0735) SpO2:  [99 %-100 %] 99 % (01/13 0745) Weight:  [77.1 kg] 77.1 kg (01/12 1912)   General:  Alert, Well-developed, well-nourished, pleasant and cooperative in NAD Head:  Normocephalic and atraumatic. Eyes:  Sclera clear, no icterus.  Conjunctiva pink. Ears:  Normal auditory acuity. Mouth:  No deformity or lesions.   Lungs:  Clear throughout to auscultation.  No wheezes, crackles, or rhonchi.  Heart:  Regular rate and rhythm; murmur noted. Abdomen:  Soft, non-distended.  BS present.  Non-tender. Rectal:  Bright red blood noted in depends underwear/coming from rectum with bright red blood on gloved finger by EDP.  Msk:  Symmetrical without gross deformities. Pulses:  Normal pulses noted. Extremities:  Without clubbing or edema. Neurologic:  Alert and oriented x 4; grossly normal neurologically. Skin:  Intact without significant lesions or rashes. Psych:  Alert and cooperative. Normal mood and affect.  Intake/Output from previous day: 01/12 0701 - 01/13 0700 In: 600 [IV Piggyback:600] Out: -   Lab Results: Recent Labs    08/09/19 1322 08/09/19 1914 08/09/19 2241 08/10/19 0738  WBC 6.7 6.3 6.1  --   HGB 11.8* 18.9* 10.2* 10.3*  HCT 36.6 57.5* 31.4* 32.2*  PLT 158 QUESTIONABLE RESULTS, RECOMMEND RECOLLECT TO VERIFY 141*  --    BMET Recent Labs    08/09/19 1322 08/10/19 0738  NA 142 143  K 4.0 3.5  CL 107 111  CO2 28 25  GLUCOSE 145* 87  BUN 33* 24*  CREATININE 1.71* 1.28*  CALCIUM 9.2 8.3*   LFT Recent Labs    08/09/19 1322  PROT 7.1  ALBUMIN 3.3*  AST 23  ALT 16  ALKPHOS 87  BILITOT 0.6   IMPRESSION:  *Painless hematochezia:  No abdominal imaging and last colonoscopy was probably close to 10 years ago.  ? diverticular vs AVM vs neoplasm.   *ABLA:  Hgb down from baseline of about 12-12.5 grams to 10.3 grams.  Repeat stable this AM tho. *Hematuria:  Seen in the ED for this last  month and now with 2 UA's showing moderate to large Hgb. *CKD stage 3  PLAN: *Will plan for colonoscopy on 1/14.  Can have clear liquids today then NPO after midnight except bowel prep. *Monitor Hgb and transfuse prn. *If bleeds briskly then may need tagged bleeding scan. *Probably should have CT scan to evaluate hematuria, but will defer to primary service.  Laban Emperor. Swade Shonka  08/10/2019, 9:08 AM

## 2019-08-10 NOTE — Progress Notes (Signed)
[facility] PROGRESS NOTE  Vika Buske NKN:[id number] DOB: Mar 18, 1941 DOA: 08/09/2019 PCP: System, Pcp Not In  Assessment/Plan:  #1.  Hematochezia.  Likely acute lower GI bleed.  Patient presented with bright red blood per rectum.  Denies any NSAID use.  Not on any anticoagulation/antiplatelet agents.  FOBT positive.  And down slowly/slightly.  Evaluated by gastroenterology who query diverticular bleed versus AVM versus neoplasm.  Recommend colonoscopy for tomorrow. -Clear liquids for now -N.p.o. past midnight -Serial CBC -Appreciate gastroenterology assistance.  2. Hematuria/abnormal urinalysis.  Urinalysis with large Hg. urinalysis with trace amounts of leukocytes, 6-10 wbc and rare bacteria. Patient denies pain/discomfort.  Provided with Rocephin in the emergency department.  He is afebrile hemodynamically stable nontoxic-appearings -We will stop Rocephin -OP follow up with nephrology -Gentle IV fluids -Monitor urine output -Follow urine culture  #3.  anemia likely acute blood loss related to #1.  Hemoglobin 9.9 this afternoon down from 11.8 yesterday afternoon. -See #1 -Monitor  #4.  Chronic kidney disease stage III.  Creatinine 1.7.  This appears to be close to her baseline.  Chart review indicates she has nephrology appointment next week -monitor  #5.  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  Home meds include Lasix. Does not appear overloaded. -medial magagement per careeverywhere -is to follow up with cards in 4 weeks -Monitor intake and output -Obtain daily weights -hold home lasix for now and resume as indicated  #6.  Hypertension.  Fair control.  Home medications include Lasix and cartia xt . -Holding home meds for now -Monitor closely and resume as indicated  #7.  Diabetes.  Chart review in care everywhere indicates she is on Humalog sliding scale and an oral agent at home.  She reports she brought "a bag  of meds in with her" unable to find these medications.  She is unable to articulate what they are serum glucose 145 on presentation.  CBGs have been running in the 31s. -Sliding scale insulin -Clear liquids as noted above -Follow hemoglobin A1c  Code Status: full Family Communication: attempted to call sister but phone # in chart is patients number. She was unable to recall sisters number Disposition Plan: discharge to home when ready   Consultants:  GI  Procedures:    Antibiotics:    HPI/Subjective: Awake alert oriented x3. Some memory issues with regards to meds and sister's phone number  Objective: Vitals:   08/10/19 1100 08/10/19 1200  BP: (!) 116/55 139/63  Pulse: (!) 46 65  Resp: 15 (!) 25  Temp:  98.4 F (36.9 C)  SpO2: 99% 99%    Intake/Output Summary (Last 24 hours) at 08/10/2019 1316 Last data filed at 08/10/2019 1225 Gross per 24 hour  Intake 600 ml  Output 400 ml  Net 200 ml   Filed Weights   08/09/19 1912  Weight: 77.1 kg    Exam:   General:  Awake alert no acute distress  Cardiovascular: rrr no mgr no trace LE edema  Respiratory: normal effort BS clear bilaterally   Abdomen: non-distended non-tender +BS no guarding or rebounding  Musculoskeletal: joints without swelling/erythema   Data Reviewed: Basic Metabolic Panel: Recent Labs  Lab 08/09/19 1322 08/10/19 0738  NA 142 143  K 4.0 3.5  CL 107 111  CO2 28 25  GLUCOSE 145* 87  BUN 33* 24*  CREATININE 1.71* 1.28*  CALCIUM 9.2 8.3*   Liver Function Tests: Recent Labs  Lab 08/09/19 1322  AST 23  ALT 16  ALKPHOS 87  BILITOT 0.6  PROT 7.1  ALBUMIN 3.3*   No results for input(s): LIPASE, AMYLASE in the last 168 hours. No results for input(s): AMMONIA in the last 168 hours. CBC: Recent Labs  Lab 08/09/19 1322 08/09/19 1914 08/09/19 2241 08/10/19 0738 08/10/19 1200  WBC 6.7 6.3 6.1  --   --   HGB 11.8* 18.9* 10.2* 10.3* 9.9*  HCT 36.6 57.5* 31.4* 32.2* 31.7*  MCV  91.5 89.3 91.0  --   --   PLT 158 QUESTIONABLE RESULTS, RECOMMEND RECOLLECT TO VERIFY 141*  --   --    Cardiac Enzymes: No results for input(s): CKTOTAL, CKMB, CKMBINDEX, TROPONINI in the last 168 hours. BNP (last 3 results) No results for input(s): BNP in the last 8760 hours.  ProBNP (last 3 results) No results for input(s): PROBNP in the last 8760 hours.  CBG: Recent Labs  Lab 08/10/19 0533 08/10/19 0737 08/10/19 1205  GLUCAP 71 76 74    Recent Results (from the past 240 hour(s))  SARS CORONAVIRUS 2 (TAT 6-24 HRS) Nasopharyngeal Nasopharyngeal Swab     Status: None   Collection Time: 08/09/19  7:11 PM   Specimen: Nasopharyngeal Swab  Result Value Ref Range Status   SARS Coronavirus 2 NEGATIVE NEGATIVE Final    Comment: (NOTE) SARS-CoV-2 target nucleic acids are NOT DETECTED. The SARS-CoV-2 RNA is generally detectable in upper and lower respiratory specimens during the acute phase of infection. Negative results do not preclude SARS-CoV-2 infection, do not rule out co-infections with other pathogens, and should not be used as the sole basis for treatment or other patient management decisions. Negative results must be combined with clinical observations, patient history, and epidemiological information. The expected result is Negative. Fact Sheet for Patients: SugarRoll.be Fact Sheet for Healthcare Providers: https://www.woods-mathews.com/ This test is not yet approved or cleared by the [place] FDA and  has been authorized for detection and/or diagnosis of SARS-CoV-2 by FDA under an Emergency Use Authorization (EUA). This EUA will remain  in effect (meaning this test can be used) for the duration of the COVID-19 declaration under Section 56 4(b)(1) of the Act, 21 U.S.C. section 360bbb-3(b)(1), unless the authorization is terminated or revoked sooner. Performed at [facility], [place] [address] [place],  [place] [address]      Studies: No results found.  Scheduled Meds: . atorvastatin  20 mg Oral QHS  . insulin aspart  0-9 Units Subcutaneous Q4H  . peg 3350 powder  1 kit Oral Once   Continuous Infusions: . sodium chloride    . cefTRIAXone (ROCEPHIN)  IV      Principal Problem:   GI bleed Active Problems:   Hematochezia   CKD (chronic kidney disease), stage III   CHF (congestive heart failure) (H[facility])   Type 2 diabetes mellitus ([facility])   HTN (hypertension)   Hematuria    Time spent: 60 minutes    [facility] M NP  [facility]  If 7PM-7AM, please contact night-coverage at [email or web address], password [facility] 08/10/2019, 1:16 PM  LOS: 0 days

## 2019-08-10 NOTE — ED Notes (Signed)
Pure Wick was placed on patient. 

## 2019-08-10 NOTE — Progress Notes (Signed)
Pt's HR went up to 171, afib, pt at that time went to the bedside commode and had a bowel movement, alert and oriented, denies dizziness or pain.  HR went down to 130's on sinus tach .  MD on call paged to report, awaiting response.

## 2019-08-10 NOTE — Progress Notes (Signed)
Pt admitted into 6N32 from ED for colonoscopy tomorrow.  Pt understands CL diet for now and NPO after MN.  Pt had urine and some blood noted in stool once arrived to the room.  Pt has a pacemaker and has a heart murmur from auscultation.

## 2019-08-10 NOTE — H&P (Addendum)
History and Physical    Candice Lynn [id number] DOB: 05-27-1941 DOA: 08/09/2019  PCP: System, Pcp Not In Patient coming from: Home  Chief Complaint: Rectal bleeding  HPI: Candice Lynn is a 79 y.o. female with medical history significant of CHF, diet-controlled type 2 diabetes, hypertension, hyperlipidemia, paroxysmal atrial tachycardia, severe mitral regurgitation, CKD stage III presenting to the ED to be evaluated after she noticed bright red blood in the toilet bowl.  Patient states she had rectal bleeding all day yesterday.  She is noticing bright red blood per rectum.  She has never had a gastrointestinal bleed before.  She is not on any blood thinners.  She has not vomited blood and does not take any over-the-counter NSAIDs.  No abdominal pain.  She has never had a colonoscopy done before.  No lightheadedness/dizziness, chest pain, shortness of breath, or palpitations.  ED Course: Hemodynamically stable.  Hemoglobin 11.8.  Rectal examination done in the ED revealed bright red blood and one small external nonthrombosed hemorrhoid.  FOBT positive. UA with trace amount of leukocytes, 6-10 WBCs, and rare bacteria.  Urine culture pending.  SARS-CoV-2 PCR test pending.  ED provider discussed the case with Dr. Carlean Purl from GI, planning on doing a colonoscopy in the morning.  Patient received ceftriaxone and a 500 cc normal saline bolus.  Review of Systems:  All systems reviewed and apart from history of presenting illness, are negative.  Past Medical History:  Diagnosis Date  . Apnea   . CHF (congestive heart failure) ([place])   . Diabetes mellitus without complication ([place])   . Hypercholesterolemia   . Hypertension     Past Surgical History:  Procedure Laterality Date  . PACEMAKER IMPLANT       reports that she has never smoked. She has never used smokeless tobacco. She reports that she does not drink alcohol or use drugs.  Allergies  Allergen Reactions  . Shellfish Allergy  Itching    History reviewed. No pertinent family history.  Prior to Admission medications   Medication Sig Start Date End Date Taking? Authorizing Provider  acetaminophen (TYLENOL) 500 MG tablet Take 500 mg by mouth every 6 (six) hours as needed for mild pain.   Yes [provider]  aspirin EC 81 MG tablet Take 81 mg by mouth daily.   Yes [provider]  atorvastatin (LIPITOR) 20 MG tablet Take 20 mg by mouth at bedtime.   Yes [provider]  CARTIA XT 180 MG 24 hr capsule Take 180 mg by mouth daily. 08/08/19  Yes [provider]  furosemide (LASIX) 40 MG tablet Take 40 mg by mouth 2 (two) times daily.   Yes [provider]    Physical Exam: Vitals:   08/09/19 1900 08/09/19 1912 08/09/19 2131 08/10/19 0235  BP: 132/63  (!) 133/99 126/60  Pulse: 64  69 60  Resp: 17  (!) 21 17  Temp:      TempSrc:      SpO2: 100%  100% 99%  Weight:  77.1 kg    Height:  5' (1.524 m)      Physical Exam  Constitutional: She is oriented to person, place, and time. She appears well-developed and well-nourished. No distress.  HENT:  Head: Normocephalic.  Eyes: Right eye exhibits no discharge. Left eye exhibits no discharge.  Cardiovascular: Normal rate, regular rhythm and intact distal pulses.  Pulmonary/Chest: Effort normal and breath sounds normal. No respiratory distress. She has no wheezes. She has no rales.  Abdominal: Soft. Bowel sounds are normal. She exhibits no distension. There is no abdominal tenderness. There is no guarding.  Musculoskeletal:        General: No edema.     Cervical back: Neck supple.  Neurological: She is alert and oriented to person, place, and time.  Skin: Skin is warm and dry. She is not diaphoretic.     Labs on Admission: I have personally reviewed following labs and imaging studies  CBC: Recent Labs  Lab 08/09/19 1322 08/09/19 1914 08/09/19 2241  WBC 6.7 6.3 6.1  HGB 11.8* 18.9* 10.2*  HCT 36.6 57.5* 31.4*    MCV 91.5 89.3 91.0  PLT 158 QUESTIONABLE RESULTS, RECOMMEND RECOLLECT TO VERIFY [id number]*   Basic Metabolic Panel: Recent Labs  Lab 08/09/19 1322  NA 142  K 4.0  CL 107  CO2 28  GLUCOSE 145*  BUN 33*  CREATININE 1.71*  CALCIUM 9.2   GFR: Estimated Creatinine Clearance: 25.3 mL/min (A) (by C-G formula based on SCr of 1.71 mg/dL (H)). Liver Function Tests: Recent Labs  Lab 08/09/19 1322  AST 23  ALT 16  ALKPHOS 87  BILITOT 0.6  PROT 7.1  ALBUMIN 3.3*   No results for input(s): LIPASE, AMYLASE in the last 168 hours. No results for input(s): AMMONIA in the last 168 hours. Coagulation Profile: No results for input(s): INR, PROTIME in the last 168 hours. Cardiac Enzymes: No results for input(s): CKTOTAL, CKMB, CKMBINDEX, TROPONINI in the last 168 hours. BNP (last 3 results) No results for input(s): PROBNP in the last 8760 hours. HbA1C: No results for input(s): HGBA1C in the last 72 hours. CBG: No results for input(s): GLUCAP in the last 168 hours. Lipid Profile: No results for input(s): CHOL, HDL, LDLCALC, TRIG, CHOLHDL, LDLDIRECT in the last 72 hours. Thyroid Function Tests: No results for input(s): TSH, T4TOTAL, FREET4, T3FREE, THYROIDAB in the last 72 hours. Anemia Panel: No results for input(s): VITAMINB12, FOLATE, FERRITIN, TIBC, IRON, RETICCTPCT in the last 72 hours. Urine analysis:    Component Value Date/Time   COLORURINE YELLOW 08/09/2019 1843   APPEARANCEUR CLEAR 08/09/2019 1843   LABSPEC 1.011 08/09/2019 1843   PHURINE 5.0 08/09/2019 1843   GLUCOSEU NEGATIVE 08/09/2019 1843   HGBUR LARGE (A) 08/09/2019 1843   BILIRUBINUR NEGATIVE 08/09/2019 1843   KETONESUR NEGATIVE 08/09/2019 1843   PROTEINUR 100 (A) 08/09/2019 1843   UROBILINOGEN 1.0 07/12/2019 1305   NITRITE NEGATIVE 08/09/2019 1843   LEUKOCYTESUR TRACE (A) 08/09/2019 1843    Radiological Exams on Admission: No results found.  Assessment/Plan Principal Problem:   GI bleed Active Problems:    CKD (chronic kidney disease), stage III   CHF (congestive heart failure) (HCC)   Type 2 diabetes mellitus (H[facility])   HTN (hypertension)   Suspected acute lower GI bleed Patient is presenting with complaints of bright red blood per rectum.  Not on any anticoagulation/antiplatelet agents.  Hemodynamically stable.  Hemoglobin 11.8. Rectal examination done in the ED revealed bright red blood and one small external nonthrombosed hemorrhoid.  FOBT positive. ED provider discussed the case with Dr. Carlean Purl from GI, planning on doing a colonoscopy in the morning.   -Type and screen -Serial H&H -Keep n.p.o. -Gentle IV fluid hydration  ?UTI UA with trace amount of leukocytes, 6-10 WBCs, and rare bacteria.  Patient is not endorsing UTI symptoms. -Received ceftriaxone, continue antibiotic -Urine culture pending  CKD stage III Stable.  Creatinine 1.7, was 1.5 in July 2020 Per care everywhere. -Continue monitor renal function  Chronic  combined systolic and diastolic congestive heart failure Appears euvolemic at this time. -Hold diuretic  Diet-controlled type 2 diabetes -Check A1c.  Sliding scale insulin sensitive every 4 hours as patient is currently n.p.o.  Hypertension Currently normotensive. -Hold antihypertensives at this time in setting of acute GI bleed  Hyperlipidemia -Continue Lipitor  DVT prophylaxis: SCDs Code Status: Full code.  Discussed with the patient. Family Communication: No family bedside. Disposition Plan: Anticipate discharge after clinical improvement. Consults called: None Admission status: It is my clinical opinion that referral for OBSERVATION is reasonable and necessary in this patient based on the above information provided. The aforementioned taken together are felt to place the patient at high risk for further clinical deterioration. However it is anticipated that the patient may be medically stable for discharge from the hospital within 24 to 48 hours.  The  medical decision making on this patient was of high complexity and the patient is at high risk for clinical deterioration, therefore this is a level 3 visit.  Shela Leff MD [facility] Pager [phone]  If 7PM-7AM, please contact night-coverage [email or web address] Password [facility]  08/10/2019, 5:26 AM

## 2019-08-10 NOTE — H&P (View-Only) (Signed)
Referring Provider:  Dr. Alroy Bailiff, [place] Primary Care Physician:  System, Pcp Not In Primary Gastroenterologist:  Unassigned  Reason for Consultation:  GI bleed  HPI: Candice Lynn is a 79 y.o. female with medical history significant of CHF, diet-controlled type 2 diabetes, hypertension, hyperlipidemia, paroxysmal atrial tachycardia, severe mitral regurgitation, CKD stage III presenting to the ED to be evaluated after she noticed bright red blood in the toilet bowl.  Patient states she has been seeing blood on her pad in her underwear for a week or 2, but was not sure initially where the blood was coming from.  Then yesterday she started noticing bright red blood per rectum with clots in the toilet bowl.  She has never had a gastrointestinal bleed before.  She is not on any blood thinners.  Her chart lists ASA 81 mg daily, but she tells me that she is not taking it.  She denies nausea, vomiting, or abdominal pain.  She thinks that she had a colonoscopy in the past, but says it was probably close to 10 years ago (she is form out of town and is just visiting/staying with family for a little while).  Hemoglobin 11.8-->10.2-->10.3 grams.  Rectal examination done in the ED revealed bright red blood and one small external nonthrombosed hemorrhoid.  FOBT positive.  EDP documented bright red blood noted in depends underwear/coming from rectum with bright red blood on gloved finger.   Also, of note, she was seen in the ED for hematuria in December and has had 2 urinalysis studies now that have shown moderate to large amount of blood/Hgb as well.    Past Medical History:  Diagnosis Date  . Apnea   . CAD (coronary artery disease)    non-obstructive  . CHF (congestive heart failure) ([place])   . CKD (chronic kidney disease), stage III   . Diabetes mellitus without complication ([place])   . Hypercholesterolemia   . Hypertension   . PAT (paroxysmal atrial tachycardia) ([place])     Past Surgical History:    Procedure Laterality Date  . PACEMAKER IMPLANT      Prior to Admission medications   Medication Sig Start Date End Date Taking? Authorizing Provider  acetaminophen (TYLENOL) 500 MG tablet Take 500 mg by mouth every 6 (six) hours as needed for mild pain.   Yes [provider]  aspirin EC 81 MG tablet Take 81 mg by mouth daily.   Yes [provider]  atorvastatin (LIPITOR) 20 MG tablet Take 20 mg by mouth at bedtime.   Yes [provider]  CARTIA XT 180 MG 24 hr capsule Take 180 mg by mouth daily. 08/08/19  Yes [provider]  furosemide (LASIX) 40 MG tablet Take 40 mg by mouth 2 (two) times daily.   Yes [provider]    Current Facility-Administered Medications  Medication Dose Route Frequency Provider Last Rate Last Admin  . 0.9 %  sodium chloride infusion   Intravenous Continuous Shela Leff, MD 100 mL/hr at 08/10/19 0524 Rate Change at 08/10/19 0524  . acetaminophen (TYLENOL) tablet 650 mg  650 mg Oral Q6H PRN Shela Leff, MD       Or  . acetaminophen (TYLENOL) suppository 650 mg  650 mg Rectal Q6H PRN Shela Leff, MD      . atorvastatin (LIPITOR) tablet 20 mg  20 mg Oral QHS Shela Leff, MD      . cefTRIAXone (ROCEPHIN) 1 g in sodium chloride 0.9 % 100 mL IVPB  1  g Intravenous Q24H Shela Leff, MD      . insulin aspart (novoLOG) injection 0-9 Units  0-9 Units Subcutaneous Q4H Shela Leff, MD       Current Outpatient Medications  Medication Sig Dispense Refill  . acetaminophen (TYLENOL) 500 MG tablet Take 500 mg by mouth every 6 (six) hours as needed for mild pain.    Marland Kitchen aspirin EC 81 MG tablet Take 81 mg by mouth daily.    Marland Kitchen atorvastatin (LIPITOR) 20 MG tablet Take 20 mg by mouth at bedtime.    Marland Kitchen CARTIA XT 180 MG 24 hr capsule Take 180 mg by mouth daily.    . furosemide (LASIX) 40 MG tablet Take 40 mg by mouth 2 (two) times daily.      Allergies as of 08/09/2019 - Review Complete 08/09/2019   Allergen Reaction Noted  . Shellfish allergy Itching 08/09/2019    History reviewed. No pertinent family history.  Social History   Socioeconomic History  . Marital status: Single    Spouse name: Not on file  . Number of children: Not on file  . Years of education: Not on file  . Highest education level: Not on file  Occupational History  . Not on file  Tobacco Use  . Smoking status: Never Smoker  . Smokeless tobacco: Never Used  Substance and Sexual Activity  . Alcohol use: No  . Drug use: No  . Sexual activity: Not on file  Other Topics Concern  . Not on file  Social History Narrative  . Not on file   Social Determinants of Health   Financial Resource Strain:   . Difficulty of Paying Living Expenses: Not on file  Food Insecurity:   . Worried About Charity fundraiser in the Last Year: Not on file  . Ran Out of Food in the Last Year: Not on file  Transportation Needs:   . Lack of Transportation (Medical): Not on file  . Lack of Transportation (Non-Medical): Not on file  Physical Activity:   . Days of Exercise per Week: Not on file  . Minutes of Exercise per Session: Not on file  Stress:   . Feeling of Stress : Not on file  Social Connections:   . Frequency of Communication with Friends and Family: Not on file  . Frequency of Social Gatherings with Friends and Family: Not on file  . Attends Religious Services: Not on file  . Active Member of Clubs or Organizations: Not on file  . Attends Archivist Meetings: Not on file  . Marital Status: Not on file  Intimate Partner Violence:   . Fear of Current or Ex-Partner: Not on file  . Emotionally Abused: Not on file  . Physically Abused: Not on file  . Sexually Abused: Not on file    Review of Systems: ROS is O/W negative except as mentioned in HPI.  Physical Exam: Vital signs in last 24 hours: Temp:  [97.6 F (36.4 C)-98 F (36.7 C)] 97.6 F (36.4 C) (01/13 0735) Pulse Rate:  [60-87] 66 (01/13  0735) Resp:  [16-21] 20 (01/13 0735) BP: (122-145)/(60-99) 145/65 (01/13 0735) SpO2:  [99 %-100 %] 99 % (01/13 0745) Weight:  [77.1 kg] 77.1 kg (01/12 1912)   General:  Alert, Well-developed, well-nourished, pleasant and cooperative in NAD Head:  Normocephalic and atraumatic. Eyes:  Sclera clear, no icterus.  Conjunctiva pink. Ears:  Normal auditory acuity. Mouth:  No deformity or lesions.   Lungs:  Clear throughout to auscultation.  No wheezes, crackles, or rhonchi.  Heart:  Regular rate and rhythm; murmur noted. Abdomen:  Soft, non-distended.  BS present.  Non-tender. Rectal:  Bright red blood noted in depends underwear/coming from rectum with bright red blood on gloved finger by EDP.  Msk:  Symmetrical without gross deformities. Pulses:  Normal pulses noted. Extremities:  Without clubbing or edema. Neurologic:  Alert and oriented x 4; grossly normal neurologically. Skin:  Intact without significant lesions or rashes. Psych:  Alert and cooperative. Normal mood and affect.  Intake/Output from previous day: 01/12 0701 - 01/13 0700 In: 600 [IV Piggyback:600] Out: -   Lab Results: Recent Labs    08/09/19 1322 08/09/19 1914 08/09/19 2241 08/10/19 0738  WBC 6.7 6.3 6.1  --   HGB 11.8* 18.9* 10.2* 10.3*  HCT 36.6 57.5* 31.4* 32.2*  PLT 158 QUESTIONABLE RESULTS, RECOMMEND RECOLLECT TO VERIFY 141*  --    BMET Recent Labs    08/09/19 1322 08/10/19 0738  NA 142 143  K 4.0 3.5  CL 107 111  CO2 28 25  GLUCOSE 145* 87  BUN 33* 24*  CREATININE 1.71* 1.28*  CALCIUM 9.2 8.3*   LFT Recent Labs    08/09/19 1322  PROT 7.1  ALBUMIN 3.3*  AST 23  ALT 16  ALKPHOS 87  BILITOT 0.6   IMPRESSION:  *Painless hematochezia:  No abdominal imaging and last colonoscopy was probably close to 10 years ago.  ? diverticular vs AVM vs neoplasm.   *ABLA:  Hgb down from baseline of about 12-12.5 grams to 10.3 grams.  Repeat stable this AM tho. *Hematuria:  Seen in the ED for this last  month and now with 2 UA's showing moderate to large Hgb. *CKD stage 3  PLAN: *Will plan for colonoscopy on 1/14.  Can have clear liquids today then NPO after midnight except bowel prep. *Monitor Hgb and transfuse prn. *If bleeds briskly then may need tagged bleeding scan. *Probably should have CT scan to evaluate hematuria, but will defer to primary service.  Candice Lynn. Candice Lynn  08/10/2019, 9:08 AM

## 2019-08-10 NOTE — ED Notes (Signed)
CBG 74. Apple juice and beef broth given to pt. NPO after midnight

## 2019-08-10 NOTE — Progress Notes (Signed)
Pt's HR sustain on 130's-140's, sinus tach, page Dr. Sharlet Salina to inform with order to do EKG.

## 2019-08-11 ENCOUNTER — Encounter (HOSPITAL_COMMUNITY)
Admission: EM | Disposition: A | Discharge status: Home / Self Care | Payer: Self-pay | Source: Home / Self Care | Attending: Internal Medicine

## 2019-08-11 ENCOUNTER — Encounter (HOSPITAL_COMMUNITY): Payer: Self-pay | Admitting: Internal Medicine

## 2019-08-11 ENCOUNTER — Inpatient Hospital Stay (HOSPITAL_COMMUNITY): Payer: Medicare Other

## 2019-08-11 ENCOUNTER — Inpatient Hospital Stay (HOSPITAL_COMMUNITY): Payer: Medicare Other | Admitting: Anesthesiology

## 2019-08-11 DIAGNOSIS — C20 Malignant neoplasm of rectum: Secondary | ICD-10-CM

## 2019-08-11 DIAGNOSIS — D125 Benign neoplasm of sigmoid colon: Secondary | ICD-10-CM | POA: Insufficient documentation

## 2019-08-11 DIAGNOSIS — K625 Hemorrhage of anus and rectum: Secondary | ICD-10-CM

## 2019-08-11 DIAGNOSIS — D122 Benign neoplasm of ascending colon: Secondary | ICD-10-CM | POA: Insufficient documentation

## 2019-08-11 DIAGNOSIS — D123 Benign neoplasm of transverse colon: Secondary | ICD-10-CM | POA: Insufficient documentation

## 2019-08-11 DIAGNOSIS — K6289 Other specified diseases of anus and rectum: Secondary | ICD-10-CM

## 2019-08-11 DIAGNOSIS — K635 Polyp of colon: Secondary | ICD-10-CM

## 2019-08-11 HISTORY — PX: COLONOSCOPY: SHX174

## 2019-08-11 HISTORY — PX: COLONOSCOPY WITH PROPOFOL: SHX5780

## 2019-08-11 HISTORY — PX: BIOPSY: SHX5522

## 2019-08-11 HISTORY — PX: POLYPECTOMY: SHX5525

## 2019-08-11 LAB — CBC
HCT: 34.1 % — ABNORMAL LOW (ref 36.0–46.0)
Hemoglobin: 11 g/dL — ABNORMAL LOW (ref 12.0–15.0)
MCH: 29.4 pg (ref 26.0–34.0)
MCHC: 32.3 g/dL (ref 30.0–36.0)
MCV: 91.2 fL (ref 80.0–100.0)
Platelets: 142 10*3/uL — ABNORMAL LOW (ref 150–400)
RBC: 3.74 MIL/uL — ABNORMAL LOW (ref 3.87–5.11)
RDW: 16.8 % — ABNORMAL HIGH (ref 11.5–15.5)
WBC: 7.9 10*3/uL (ref 4.0–10.5)
nRBC: 0.3 % — ABNORMAL HIGH (ref 0.0–0.2)

## 2019-08-11 LAB — GLUCOSE, CAPILLARY
Glucose-Capillary: 105 mg/dL — ABNORMAL HIGH (ref 70–99)
Glucose-Capillary: 92 mg/dL (ref 70–99)
Glucose-Capillary: 167 mg/dL — ABNORMAL HIGH (ref 70–99)
Glucose-Capillary: 91 mg/dL (ref 70–99)

## 2019-08-11 LAB — BASIC METABOLIC PANEL
Anion gap: 13 (ref 5–15)
BUN: 14 mg/dL (ref 8–23)
CO2: 19 mmol/L — ABNORMAL LOW (ref 22–32)
Calcium: 8.8 mg/dL — ABNORMAL LOW (ref 8.9–10.3)
Chloride: 113 mmol/L — ABNORMAL HIGH (ref 98–111)
Creatinine, Ser: 1.21 mg/dL — ABNORMAL HIGH (ref 0.44–1.00)
GFR calc Af Amer: 50 mL/min — ABNORMAL LOW (ref >60)
GFR calc non Af Amer: 43 mL/min — ABNORMAL LOW (ref >60)
Glucose, Bld: 106 mg/dL — ABNORMAL HIGH (ref 70–99)
Potassium: 5.3 mmol/L — ABNORMAL HIGH (ref 3.5–5.1)
Sodium: 145 mmol/L (ref 135–145)

## 2019-08-11 SURGERY — COLONOSCOPY WITH PROPOFOL
Anesthesia: Monitor Anesthesia Care

## 2019-08-11 MED ORDER — INSULIN ASPART 100 UNIT/ML ~~LOC~~ SOLN
0.0000 [IU] | Freq: Three times a day (TID) | SUBCUTANEOUS | Status: DC
  Administered 2019-08-11: 3 [IU] via SUBCUTANEOUS

## 2019-08-11 MED ORDER — PROPOFOL 10 MG/ML IV BOLUS
INTRAVENOUS | Status: DC | PRN
  Administered 2019-08-11: 15 mg via INTRAVENOUS
  Administered 2019-08-11: 10 mg via INTRAVENOUS

## 2019-08-11 MED ORDER — IOHEXOL 300 MG/ML  SOLN
80.0000 mL | Freq: Once | INTRAMUSCULAR | Status: AC | PRN
  Administered 2019-08-11: 80 mL via INTRAVENOUS

## 2019-08-11 MED ORDER — LIDOCAINE HCL (CARDIAC) PF 100 MG/5ML IV SOSY
PREFILLED_SYRINGE | INTRAVENOUS | Status: DC | PRN
  Administered 2019-08-11: 50 mg via INTRAVENOUS

## 2019-08-11 MED ORDER — SODIUM CHLORIDE 0.9 % IV SOLN: INTRAVENOUS | Status: DC

## 2019-08-11 MED ORDER — PROPOFOL 500 MG/50ML IV EMUL
INTRAVENOUS | Status: DC | PRN
  Administered 2019-08-11: 50 ug/kg/min via INTRAVENOUS

## 2019-08-11 MED ORDER — INSULIN ASPART 100 UNIT/ML ~~LOC~~ SOLN: 0.0000 [IU] | Freq: Every day | SUBCUTANEOUS | Status: DC

## 2019-08-11 MED ORDER — LACTATED RINGERS IV SOLN: INTRAVENOUS | Status: DC

## 2019-08-11 SURGICAL SUPPLY — 22 items

## 2019-08-11 NOTE — Progress Notes (Signed)
Progress Note    Candice Lynn  [id number] DOB: 1941-03-07  DOA: 08/09/2019 PCP: System, Pcp Not In    Brief Narrative:     Medical records reviewed and are as summarized below:  Candice Lynn is an 79 y.o. female with medical history significant of CHF, diet-controlled type 2 diabetes, hypertension, hyperlipidemia, paroxysmal atrial tachycardia, severe mitral regurgitation, CKD stage III presenting to the ED to be evaluated after she noticed bright red blood in the toilet bowl.  Patient states she had rectal bleeding all day yesterday.  She is noticing bright red blood per rectum.  She has never had a gastrointestinal bleed before.  She is not on any blood thinners.  She has not vomited blood and does not take any over-the-counter NSAIDs.  No abdominal pain.  She has never had a colonoscopy done before.  Here visiting her sister, from the [place] area.   Assessment/Plan:   Principal Problem:   Lower GI bleed Active Problems:   CKD (chronic kidney disease), stage III   CHF (congestive heart failure) (HCC)   Type 2 diabetes mellitus (HCC)   HTN (hypertension)   Hematochezia   Hematuria   Rectal mass   Benign neoplasm of ascending colon   Benign neoplasm of transverse colon   Benign neoplasm of sigmoid colon   Rectal mass with possible right-sided diverticulosis causing lower GI bleeding -s/p colonoscopy:   -Rectal mass 0 to 3 cm from the anal verge.  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  - Three 3 to 4 mm polyps in the sigmoid colon, at  the hepatic flexure and in the ascending colon,  removed with a cold snare. Resected and retrieved.  - Diverticulosis in the sigmoid colon, in the  distal descending colon, in the ascending colon and   in the cecum.  - Blood in the entire examined colon. Rectal tumor  has definitely been bleeding, but given blood in  the right colon, I cannot exclude a concomitant right-sided diverticular bleeding. No active   bleeding found in the right colon during this exam  Hematuria -getting CT scan -if patient plans to follow up in [place], will make referral pending CT scan  CKD stage IIIa Stable.  Creatinine 1.7, was 1.5 in July 2020 Per care everywhere. -Continue monitor renal function -resume lasix when able -has nephrologist in [place]  Chronic combined systolic and diastolic congestive heart failure Appears euvolemic at this time. -Hold diuretic  Diet-controlled type 2 diabetes -SSI -hgbA1c: 5.6  Hypertension -on lasix at home  Hyperlipidemia -Continue Lipitor  obesity Body mass index is 33.2 kg/m.   Family Communication/Anticipated D/C date and plan/Code Status   DVT prophylaxis: scd Code Status: Full Code.  Family Communication:  Disposition Plan: path pending, CT scan chest/abd/pelvis pending-- course complicated as her regular MDs and home is in [place] [facility]:    GI     Subjective:   S/p colonoscopy  Objective:    Vitals:   08/11/19 0940 08/11/19 0950 08/11/19 0952 08/11/19 1052  BP: (!) 139/46 128/86  (!) 147/62  Pulse: 73 69 69 70  Resp: 19 15 15 15   Temp:    97.9 F (36.6 C)  TempSrc:    Oral  SpO2: 97% 100% 100% 100%  Weight:      Height:        Intake/Output Summary (Last 24 hours) at 08/11/2019 1221 Last data filed at 08/11/2019 0939 Gross per 24 hour  Intake 400 ml  Output 801 ml  Net -401 ml   Filed Weights   08/09/19 1912  Weight: 77.1 kg    Exam: In bed, NAD rrr +BS, soft Alert but flat in affect  Data Reviewed:   I have personally reviewed following labs and imaging studies:  Labs: Labs show the following:   Basic Metabolic Panel: Recent Labs  Lab 08/09/19 1322 08/09/19 1322 08/10/19 0738 08/11/19 0150  NA 142  --  143 145  K 4.0   < > 3.5 5.3*  CL 107  --  111 113*  CO2 28  --  25 19*  GLUCOSE 145*  --  87 106*  BUN 33*  --  24* 14  CREATININE 1.71*  --  1.28* 1.21*  CALCIUM  9.2  --  8.3* 8.8*   < > = values in this interval not displayed.   GFR Estimated Creatinine Clearance: 35.7 mL/min (A) (by C-G formula based on SCr of 1.21 mg/dL (H)). Liver Function Tests: Recent Labs  Lab 08/09/19 1322  AST 23  ALT 16  ALKPHOS 87  BILITOT 0.6  PROT 7.1  ALBUMIN 3.3*   No results for input(s): LIPASE, AMYLASE in the last 168 hours. No results for input(s): AMMONIA in the last 168 hours. Coagulation profile No results for input(s): INR, PROTIME in the last 168 hours.  CBC: Recent Labs  Lab 08/09/19 1322 08/09/19 1322 08/09/19 1914 08/09/19 2241 08/10/19 0738 08/10/19 1200 08/11/19 0150  WBC 6.7  --  6.3 6.1  --   --  7.9  HGB 11.8*   < > 18.9* 10.2* 10.3* 9.9* 11.0*  HCT 36.6   < > 57.5* 31.4* 32.2* 31.7* 34.1*  MCV 91.5  --  89.3 91.0  --   --  91.2  PLT 158  --  QUESTIONABLE RESULTS, RECOMMEND RECOLLECT TO VERIFY 141*  --   --  142*   < > = values in this interval not displayed.   Cardiac Enzymes: No results for input(s): CKTOTAL, CKMB, CKMBINDEX, TROPONINI in the last 168 hours. BNP (last 3 results) No results for input(s): PROBNP in the last 8760 hours. CBG: Recent Labs  Lab 08/10/19 1851 08/10/19 2022 08/10/19 2341 08/11/19 0443 08/11/19 1121  GLUCAP 161* 99 99 105* 92   D-Dimer: No results for input(s): DDIMER in the last 72 hours. Hgb A1c: Recent Labs    08/10/19 1200  HGBA1C 5.6   Lipid Profile: No results for input(s): CHOL, HDL, LDLCALC, TRIG, CHOLHDL, LDLDIRECT in the last 72 hours. Thyroid function studies: No results for input(s): TSH, T4TOTAL, T3FREE, THYROIDAB in the last 72 hours.  Invalid input(s): FREET3 Anemia work up: No results for input(s): VITAMINB12, FOLATE, FERRITIN, TIBC, IRON, RETICCTPCT in the last 72 hours. Sepsis Labs: Recent Labs  Lab 08/09/19 1322 08/09/19 1914 08/09/19 2241 08/11/19 0150  WBC 6.7 6.3 6.1 7.9    Microbiology Recent Results (from the past 240 hour(s))  Urine culture      Status: Abnormal (Preliminary result)   Collection Time: 08/09/19  6:43 PM   Specimen: Urine, Random  Result Value Ref Range Status   Specimen Description URINE, RANDOM  Final   Special Requests NONE  Final   Culture (A)  Final    20,000 COLONIES/mL ESCHERICHIA COLI SUSCEPTIBILITIES TO FOLLOW Performed at [facility], [place] [address], [place], [place] [address]    Report Status PENDING  Incomplete  SARS CORONAVIRUS 2 (TAT 6-24 HRS) Nasopharyngeal Nasopharyngeal Swab  Status: None   Collection Time: 08/09/19  7:11 PM   Specimen: Nasopharyngeal Swab  Result Value Ref Range Status   SARS Coronavirus 2 NEGATIVE NEGATIVE Final    Comment: (NOTE) SARS-CoV-2 target nucleic acids are NOT DETECTED. The SARS-CoV-2 RNA is generally detectable in upper and lower respiratory specimens during the acute phase of infection. Negative results do not preclude SARS-CoV-2 infection, do not rule out co-infections with other pathogens, and should not be used as the sole basis for treatment or other patient management decisions. Negative results must be combined with clinical observations, patient history, and epidemiological information. The expected result is Negative. Fact Sheet for Patients: SugarRoll.be Fact Sheet for Healthcare Providers: https://www.woods-mathews.com/ This test is not yet approved or cleared by the [place] FDA and  has been authorized for detection and/or diagnosis of SARS-CoV-2 by FDA under an Emergency Use Authorization (EUA). This EUA will remain  in effect (meaning this test can be used) for the duration of the COVID-19 declaration under Section 56 4(b)(1) of the Act, 21 U.S.C. section 360bbb-3(b)(1), unless the authorization is terminated or revoked sooner. Performed at [facility], [place] [address] [place], [place] [address]     Procedures and diagnostic studies:  No results found.  Medications:   .  atorvastatin  20 mg Oral QHS  . insulin aspart  0-9 Units Subcutaneous Q4H   Continuous Infusions:   LOS: 1 day   Geradine Girt  [facility]   How to contact the [facility] Attending or Consulting provider [place] or covering provider during after hours [place], for this patient?  1. Check the care team in [facility] and look for a) attending/consulting TR[facility] provider listed and b) the [facility] team listed 2. Log into [email or web address] and use [place]'s universal password to access. If you do not have the password, please contact the hospital operator. 3. Locate the [facility] provider you are looking for under [facility] and page to a number that you can be directly reached. 4. If you still have difficulty reaching the provider, please page the [facility] (Director on Call) for the Hospitalists listed on amion for assistance.  08/11/2019, 12:21 PM

## 2019-08-11 NOTE — Anesthesia Procedure Notes (Signed)
Procedure Name: MAC Date/Time: 08/11/2019 8:22 AM Performed by: Mariea Clonts, CRNA Pre-anesthesia Checklist: Patient identified, Emergency Drugs available, Suction available, Timeout performed and Patient being monitored Patient Re-evaluated:Patient Re-evaluated prior to induction Oxygen Delivery Method: Nasal cannula

## 2019-08-11 NOTE — Anesthesia Preprocedure Evaluation (Signed)
Anesthesia Evaluation    Airway Mallampati: I       Dental no notable dental hx. (+) Teeth Intact   Pulmonary neg pulmonary ROS,    Pulmonary exam normal breath sounds clear to auscultation       Cardiovascular hypertension, + CAD and +CHF  Normal cardiovascular exam Rhythm:Regular Rate:Normal     Neuro/Psych negative neurological ROS  negative psych ROS   GI/Hepatic negative GI ROS, Neg liver ROS,   Endo/Other  diabetes, Type 2, Oral Hypoglycemic Agents  Renal/GU Renal diseaseCKD III     Musculoskeletal negative musculoskeletal ROS (+)   Abdominal (+) + obese,   Peds  Hematology  (+) anemia ,   Anesthesia Other Findings   Reproductive/Obstetrics                             Anesthesia Physical Anesthesia Plan  ASA: II  Anesthesia Plan: MAC   Post-op Pain Management:    Induction:   PONV Risk Score and Plan: Propofol infusion  Airway Management Planned: Nasal Cannula, Simple Face Mask and Natural Airway  Additional Equipment:   Intra-op Plan:   Post-operative Plan:   Informed Consent: I have reviewed the patients History and Physical, chart, labs and discussed the procedure including the risks, benefits and alternatives for the proposed anesthesia with the patient or authorized representative who has indicated his/her understanding and acceptance.       Plan Discussed with: CRNA  Anesthesia Plan Comments:         Anesthesia Quick Evaluation

## 2019-08-11 NOTE — Op Note (Signed)
[facility] Patient Name: Candice Lynn Procedure Date : 08/11/2019 MRN: [id number] Attending MD: Jerene Bears , MD Date of Birth: 1941-01-02 CSN: [id number] Age: 79 Admit Type: Inpatient Procedure:                Colonoscopy Indications:              Hematochezia, Rectal bleeding Providers:                Lajuan Lines. Hilarie Fredrickson, MD, Angus Seller, William Dalton,                            Technician, Theodora Blow, Technician Referring MD:             [facility] Medicines:                Monitored Anesthesia Care Complications:            No immediate complications. Estimated Blood Loss:     Estimated blood loss was minimal. Procedure:                Pre-Anesthesia Assessment:                           - Prior to the procedure, a History and Physical                            was performed, and patient medications and                            allergies were reviewed. The patient's tolerance of                            previous anesthesia was also reviewed. The risks                            and benefits of the procedure and the sedation                            options and risks were discussed with the patient.                            All questions were answered, and informed consent                            was obtained. Prior Anticoagulants: The patient has                            taken no previous anticoagulant or antiplatelet                            agents. ASA Grade Assessment: III - A patient with                            severe systemic disease. After reviewing the risks  and benefits, the patient was deemed in                            satisfactory condition to undergo the procedure.                           After obtaining informed consent, the colonoscope                            was passed under direct vision. Throughout the                            procedure, the patient's blood pressure, pulse, and                             oxygen saturations were monitored continuously. The                            [id number]-[id number] Olympus pediatric colonoscope                            was introduced through the anus and advanced to the                            cecum, identified by appendiceal orifice and                            ileocecal valve. The colonoscopy was performed                            without difficulty. The patient tolerated the                            procedure well. The quality of the bowel                            preparation was good. The ileocecal valve,                            appendiceal orifice, and rectum were photographed. Scope In: 8:49:53 AM Scope Out: 9:16:57 AM Scope Withdrawal Time: 0 hours 20 minutes 22 seconds  Total Procedure Duration: 0 hours 27 minutes 4 seconds  Findings:      The digital rectal exam revealed a 3 cm (diameter) firm rectal mass       palpated 0 to 3 cm from the anal verge. The mass was non-circumferential.      A fungating and ulcerated with a visible vessel, non-obstructing mass       was found in the distal rectum. The mass was non-circumferential. The       mass measured three cm in length. In addition, its diameter measured       three mm. This mass extends to the dentate line. This was biopsied with       a cold forceps for histology.      Three sessile polyps were found in the sigmoid colon, hepatic  flexure       and ascending colon. The polyps were 3 to 4 mm in size. These polyps       were removed with a cold snare. Resection and retrieval were complete.      Multiple small and large-mouthed diverticula were found in the sigmoid       colon, distal descending colon, ascending colon and cecum.      Red blood mixed with expected bowel prep was found in the entire colon.       Irrigation and lavage was performed but no other source of bleeding was       found. Impression:               - Rectal mass 0 to 3 cm from  the anal verge.                           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                           - Three 3 to 4 mm polyps in the sigmoid colon, at                            the hepatic flexure and in the ascending colon,                            removed with a cold snare. Resected and retrieved.                           - Diverticulosis in the sigmoid colon, in the                            distal descending colon, in the ascending colon and                            in the cecum.                           - Blood in the entire examined colon. Rectal tumor                            has definitely been bleeding, but given blood in                            the right colon, I cannot exclude a concomitant                            right-sided diverticular bleeding. No active                            bleeding found in the right colon during this exam. Moderate Sedation:      N/A Recommendation:           - Return patient to hospital ward for ongoing care.                           -  Advance diet as tolerated.                           - Await pathology results.                           - Perform a CT scan (computed tomography) of                            abdomen with contrast, pelvis with contrast and                            chest without contrast. Depending on results may                            need EUS (MRI may not be possible due to pacemaker).                           - Repeat colonoscopy likely in 1 year for                            surveillance.                           - Anticipate surgical and oncologic consultation. Procedure Code(s):        --- Professional ---                           [phone], Colonoscopy, flexible; with removal of                            tumor(s), polyp(s), or other lesion(s) by snare                            technique                           45380, 19, Colonoscopy, flexible; with biopsy,                             single or multiple Diagnosis Code(s):        --- Professional ---                           K62.89, Other specified diseases of anus and rectum                           C20, Malignant neoplasm of rectum                           K63.5, Polyp of colon                           K92.2, Gastrointestinal hemorrhage, unspecified                           K92.1,  Melena (includes Hematochezia)                           K62.5, Hemorrhage of anus and rectum                           K57.30, Diverticulosis of large intestine without                            perforation or abscess without bleeding CPT copyright 2019 [employer]. All rights reserved. The codes documented in this report are preliminary and upon coder review may  be revised to meet current compliance requirements. Jerene Bears, MD 08/11/2019 9:30:21 AM This report has been signed electronically. Number of Addenda: 0

## 2019-08-11 NOTE — Significant Event (Addendum)
Rapid Response Event Note  Overview: Called because pt syncopal on bedside commode, HR-150s prior to bed getting OOB(baseline 80s). Pt here with lower GIB. Pt in middle of bowel prep for lower EGD in AM.  Last hbg was 9.9 at noon today. Time Called: 2336 Arrival Time: 2339 Event Type: Neurologic, Respiratory, Cardiac  Initial Focused Assessment: Pt laying in bed(staff had just gotten her from [facility] to bed), lethargic, but able to open eyes, tell me her name, and move all extremities to command. HR on my arrival was 143 on tele box however pt HR dropped to 80s during my assessment. BP-126/67, RR-16, SpO2-100% on RA. Lungs clear r/o, heart murmur present, skin warm and dry.   Interventions: Continue tele monitoring Bedrest tonight CBC ordered for AM EKG-SR with 1st degree HB(done after pt no longer tachy) EKG strip on monitor analyzed during tachcardia and appears to be ST.  Plan of Care (if not transferred): ???syncopy from tachycardia/orthostatic BP or vagal response??? Bedrest tonight please. Continue to monitor pt. Call RRT if further assistance needed.  Event Summary: Name of Physician Notified: Sharlet Salina, NP at   [place]    at       Event End Time: 0005  Dillard [place]

## 2019-08-11 NOTE — Transfer of Care (Signed)
Immediate Anesthesia Transfer of Care Note  Patient: Candice Lynn  Procedure(s) Performed: COLONOSCOPY WITH PROPOFOL (N/A ) POLYPECTOMY BIOPSY  Patient Location: Endoscopy Unit  Anesthesia Type:MAC  Level of Consciousness: awake, alert  and oriented  Airway & Oxygen Therapy: Patient Spontanous Breathing and Patient connected to nasal cannula oxygen  Post-op Assessment: Report given to RN, Post -op Vital signs reviewed and stable and Patient moving all extremities X 4  Post vital signs: Reviewed and stable  Last Vitals:  Vitals Value Taken Time  BP 124/98 08/11/19 0927  Temp    Pulse 72 08/11/19 0929  Resp 17 08/11/19 0929  SpO2 100 % 08/11/19 0929  Vitals shown include unvalidated device data.  Last Pain:  Vitals:   08/11/19 0720  TempSrc: Temporal  PainSc: 0-No pain         Complications: No apparent anesthesia complications

## 2019-08-11 NOTE — Progress Notes (Signed)
Pt has slept at short intervals tonight. No further episodes of "passing" out. Pt has been talkative, cooperative, remains on bedrest. Pt has completed prep this a.m.Marland Kitchen

## 2019-08-11 NOTE — Plan of Care (Signed)

## 2019-08-11 NOTE — Interval H&P Note (Signed)
History and Physical Interval Note: For colonoscopy this morning to eval hematochezia. CBC Latest Ref Rng & Units 08/11/2019 08/10/2019 08/10/2019  WBC 4.0 - 10.5 K/uL 7.9 - -  Hemoglobin 12.0 - 15.0 g/dL 11.0(L) 9.9(L) 10.3(L)  Hematocrit 36.0 - 46.0 % 34.1(L) 31.7(L) 32.2(L)  Platelets 150 - 400 K/uL 142(L) - -  The nature of the procedure, as well as the risks, benefits, and alternatives were carefully and thoroughly reviewed with the patient. Ample time for discussion and questions allowed. The patient understood, was satisfied, and agreed to proceed.     08/11/2019 8:29 AM  Dondra Spry  has presented today for surgery, with the diagnosis of GI bleed.  The various methods of treatment have been discussed with the patient and family. After consideration of risks, benefits and other options for treatment, the patient has consented to  Procedure(s): COLONOSCOPY WITH PROPOFOL (N/A) as a surgical intervention.  The patient's history has been reviewed, patient examined, no change in status, stable for surgery.  I have reviewed the patient's chart and labs.  Questions were answered to the patient's satisfaction.     Lajuan Lines Jeralynn Vaquera

## 2019-08-11 NOTE — Plan of Care (Signed)

## 2019-08-12 ENCOUNTER — Other Ambulatory Visit: Payer: Self-pay

## 2019-08-12 DIAGNOSIS — K6289 Other specified diseases of anus and rectum: Secondary | ICD-10-CM

## 2019-08-12 LAB — GLUCOSE, CAPILLARY
Glucose-Capillary: 86 mg/dL (ref 70–99)
Glucose-Capillary: 93 mg/dL (ref 70–99)
Glucose-Capillary: 134 mg/dL — ABNORMAL HIGH (ref 70–99)

## 2019-08-12 LAB — BASIC METABOLIC PANEL
Anion gap: 9 (ref 5–15)
BUN: 18 mg/dL (ref 8–23)
CO2: 22 mmol/L (ref 22–32)
Calcium: 8.4 mg/dL — ABNORMAL LOW (ref 8.9–10.3)
Chloride: 108 mmol/L (ref 98–111)
Creatinine, Ser: 1.22 mg/dL — ABNORMAL HIGH (ref 0.44–1.00)
GFR calc Af Amer: 49 mL/min — ABNORMAL LOW (ref >60)
GFR calc non Af Amer: 43 mL/min — ABNORMAL LOW (ref >60)
Glucose, Bld: 91 mg/dL (ref 70–99)
Potassium: 4.1 mmol/L (ref 3.5–5.1)
Sodium: 139 mmol/L (ref 135–145)

## 2019-08-12 LAB — CBC
HCT: 26.2 % — ABNORMAL LOW (ref 36.0–46.0)
HCT: 30.4 % — ABNORMAL LOW (ref 36.0–46.0)
Hemoglobin: 8.5 g/dL — ABNORMAL LOW (ref 12.0–15.0)
Hemoglobin: 10 g/dL — ABNORMAL LOW (ref 12.0–15.0)
MCH: 29.5 pg (ref 26.0–34.0)
MCH: 29.5 pg (ref 26.0–34.0)
MCHC: 32.4 g/dL (ref 30.0–36.0)
MCHC: 32.9 g/dL (ref 30.0–36.0)
MCV: 91 fL (ref 80.0–100.0)
MCV: 89.7 fL (ref 80.0–100.0)
Platelets: 130 10*3/uL — ABNORMAL LOW (ref 150–400)
Platelets: 163 10*3/uL (ref 150–400)
RBC: 2.88 MIL/uL — ABNORMAL LOW (ref 3.87–5.11)
RBC: 3.39 MIL/uL — ABNORMAL LOW (ref 3.87–5.11)
RDW: 16.9 % — ABNORMAL HIGH (ref 11.5–15.5)
RDW: 16.7 % — ABNORMAL HIGH (ref 11.5–15.5)
WBC: 6.6 10*3/uL (ref 4.0–10.5)
WBC: 9.3 10*3/uL (ref 4.0–10.5)
nRBC: 0 % (ref 0.0–0.2)
nRBC: 0 % (ref 0.0–0.2)

## 2019-08-12 LAB — URINE CULTURE: Culture: 20000 — AB

## 2019-08-12 LAB — TSH: TSH: 2.914 u[IU]/mL (ref 0.350–4.500)

## 2019-08-12 MED ORDER — DILTIAZEM HCL-DEXTROSE 125-5 MG/125ML-% IV SOLN (PREMIX)
5.0000 mg/h | INTRAVENOUS | Status: DC
  Filled 2019-08-12: qty 125

## 2019-08-12 MED ORDER — DILTIAZEM HCL 25 MG/5ML IV SOLN
10.0000 mg | Freq: Once | INTRAVENOUS | Status: AC
  Administered 2019-08-12: 10 mg via INTRAVENOUS
  Filled 2019-08-12: qty 5

## 2019-08-12 MED ORDER — ENOXAPARIN SODIUM 30 MG/0.3ML ~~LOC~~ SOLN
30.0000 mg | SUBCUTANEOUS | Status: DC
  Filled 2019-08-12: qty 0.3

## 2019-08-12 MED ORDER — METOPROLOL TARTRATE 50 MG PO TABS
50.0000 mg | ORAL_TABLET | Freq: Two times a day (BID) | ORAL | Status: DC
  Administered 2019-08-12 – 2019-08-16 (×9): 50 mg via ORAL
  Filled 2019-08-12 (×9): qty 1

## 2019-08-12 MED ORDER — DILTIAZEM LOAD VIA INFUSION
20.0000 mg | Freq: Once | INTRAVENOUS | Status: DC
  Filled 2019-08-12: qty 20

## 2019-08-12 MED ORDER — METOPROLOL TARTRATE 5 MG/5ML IV SOLN
5.0000 mg | INTRAVENOUS | Status: DC | PRN
  Administered 2019-08-12: 5 mg via INTRAVENOUS
  Filled 2019-08-12: qty 5

## 2019-08-12 MED ORDER — DILTIAZEM HCL ER COATED BEADS 180 MG PO CP24: 180.0000 mg | ORAL_CAPSULE | Freq: Every day | ORAL | Status: DC

## 2019-08-12 NOTE — Significant Event (Addendum)
Rapid Response Event Note  Overview: Called for pt with HR 150-160s. Md has seen pt and ordered cardizem gtt. Orders placed to transfer pt to PCU. RN requesting help setting up Cardizem and monitoring until pt transferred. I instructed RN to call when med was available and I would come start it and wait with pt.   1215: Notified that Cardizem gtt on unit.   Initial Focused Assessment/Interventions: On arrival, pt sitting up in bed eating lunch asyptomatic. HR 177, BP 132/90, RR 16, spO2 99% on RA. No c/o pain, tightness, heart racing, sob. EKG obtained showing SVT with rate 170s. Md notified and order received for Metoprolol IV q89min for HR >130  5mg  Metoprolol given- HR improved to 98. BP 130/71 (87).  Plan of Care (if not transferred): Pt transferred to [facility] for closer monitoring and possible need for drip to control HR. Bedside RN made aware.   Continue to watch HR and BP, call with any issues or concerns.   Event Summary:   called at  1145   event ended at  [address]

## 2019-08-12 NOTE — Progress Notes (Addendum)
Report called and given to Beverlee Nims, RN on 3E. All questions answered to satisfaction. This RN and Lexine Baton, Rapid Response RN, to transfer pt to 3E25 with all belongings via bed.

## 2019-08-12 NOTE — Progress Notes (Addendum)
Daily Rounding Note  08/12/2019, 11:13 AM  LOS: 2 days   SUBJECTIVE:   Chief complaint:   Hematochezia.  Rectal mass  Rate is once again tachycardic in the 150s and there is plans to initiate Cardizem drip and transfer her to stepdown, progressive unit where they can give the Cardizem drip.  Patient has not had any bowel movement since prior to colonoscopy.  No abdominal pain.   OBJECTIVE:         Vital signs in last 24 hours:    Temp:  [97.3 F (36.3 C)-99.1 F (37.3 C)] 98.5 F (36.9 C) (01/15 1000) Pulse Rate:  [79-149] 149 (01/15 1000) Resp:  [18-24] 20 (01/15 1000) BP: (140-158)/(66-101) 147/101 (01/15 1000) SpO2:  [100 %] 100 % (01/15 1000) Last BM Date: 08/10/19 Filed Weights   08/09/19 1912  Weight: 77.1 kg   General: Alert.  Comfortable.  Does not look ill. Heart: Tachycardic regular, rate in the 150s. Chest: Clear bilaterally.  No labored breathing. Abdomen: Soft.  Not tender.  Active bowel sounds no distention. Extremities: No CCE. Neuro/Psych: Alert.  Appropriate.  Oriented x3.  Somewhat anxious.  Intake/Output from previous day: 01/14 0701 - 01/15 0700 In: 640 [P.O.:240; I.V.:400] Out: 850 [Urine:850]  Intake/Output this shift: Total I/O In: -  Out: 450 [Urine:450]  Lab Results: Recent Labs    08/09/19 2241 08/10/19 0738 08/10/19 1200 08/11/19 0150 08/12/19 0352  WBC 6.1  --   --  7.9 6.6  HGB 10.2*   < > 9.9* 11.0* 8.5*  HCT 31.4*   < > 31.7* 34.1* 26.2*  PLT 141*  --   --  142* 130*   < > = values in this interval not displayed.   BMET Recent Labs    08/10/19 0738 08/11/19 0150 08/12/19 0352  NA 143 145 139  K 3.5 5.3* 4.1  CL 111 113* 108  CO2 25 19* 22  GLUCOSE 87 106* 91  BUN 24* 14 18  CREATININE 1.28* 1.21* 1.22*  CALCIUM 8.3* 8.8* 8.4*   LFT Recent Labs    08/09/19 1322  PROT 7.1  ALBUMIN 3.3*  AST 23  ALT 16  ALKPHOS 87  BILITOT 0.6   PT/INR No  results for input(s): LABPROT, INR in the last 72 hours. Hepatitis Panel No results for input(s): HEPBSAG, HCVAB, HEPAIGM, HEPBIGM in the last 72 hours.  Studies/Results: CT CHEST W CONTRAST  Result Date: 08/11/2019 CLINICAL DATA:  Rectal mass on colonoscopy today. EXAM: CT CHEST, ABDOMEN, AND PELVIS WITH CONTRAST TECHNIQUE: Multidetector CT imaging of the chest, abdomen and pelvis was performed following the standard protocol during bolus administration of intravenous contrast. CONTRAST:  66mL OMNIPAQUE IOHEXOL 300 MG/ML  SOLN COMPARISON:  None. FINDINGS: CT CHEST FINDINGS Cardiovascular: The heart is within normal limits in size for age. There is moderate left atrial enlargement noted. No pericardial effusion. Scattered aortic calcifications and coronary artery calcifications are noted. The pulmonary arteries appear grossly normal. Extensive left-sided chest wall collateral vessels are noted. I suspect there may be some stenosis of the left brachiocephalic vein due to the pacer wires. Mediastinum/Nodes: No mediastinal or hilar mass or adenopathy. The esophagus is grossly normal. Lungs/Pleura: Emphysematous changes and fairly significant pulmonary scarring along with areas of bronchiectasis. No superimposed infiltrates or effusions. No worrisome pulmonary lesions. No pulmonary nodules to suggest pulmonary metastatic disease. No pleural effusions. Chest wall/musculoskeletal: No breast masses, supraclavicular or axillary adenopathy. Enlarged thyroid gland with multiple  thyroid nodules. None of these measures larger than 8 mm. This is likely multinodular goiter. CT ABDOMEN PELVIS FINDINGS Hepatobiliary: No focal hepatic lesions to suggest hepatic metastatic disease. No intra or extrahepatic biliary dilatation. The gallbladder appears normal. Pancreas: No mass, inflammation or ductal dilatation. Spleen: Normal in size. No focal lesions. Numerous calcified granulomas. Adrenals/Urinary Tract: The adrenal glands  are unremarkable. Bilateral renal cysts are noted. 17 mm indeterminate lesion projecting off the midpole region of the left kidney posteriorly. This demonstrates high attenuation (68 Hounsfield units). It could be an enhancing solid renal lesion or a hemorrhagic cyst. Slightly more inferior and lateral to this lesion is a 15 mm lesion which measures 37 Hounsfield units and also could be a hemorrhagic cyst. 16 mm hyperdense lesion projecting off the medial cortex of the left kidney in the midpole region on image 58/3 is also indeterminate. Hyperdense lesion projecting off the midpole region of the right kidney laterally measures 73 Hounsfield units and is indeterminate. The bladder is unremarkable. Stomach/Bowel: The stomach, duodenum and small bowel are unremarkable. No acute inflammatory changes, mass lesions or obstructive findings. Diffuse colonic diverticulosis without findings for acute diverticulitis. Ill-defined right-sided rectal wall thickening consistent with patient's known rectal cancer. No obvious perirectal involvement. No perirectal adenopathy or pelvic sidewall adenopathy the. No enlarged nodes in the sigmoid mesocolon. Vascular/Lymphatic: Advanced atherosclerotic calcifications involving the aorta iliac arteries but no aneurysm or dissection. The branch vessels are patent. The major venous structures are patent. No mesenteric or retroperitoneal lymphadenopathy. Small medial and lateral external iliac artery lymph nodes are noted. 7 mm lymph node on image 99/3. 5 mm left-sided node on image 99/3. 5 mm right lateral external iliac node on image 98/3. 11 mm right common femoral node on image 104/3. Left lateral external iliac node on image 100/3. 13 mm right inguinal lymph node on image number 111/3. Reproductive: The uterus is surgically absent. Both ovaries are still present and appear normal. Other: No free pelvic fluid collections. Small periumbilical abdominal wall hernia containing fat.  Musculoskeletal: No significant bony findings. IMPRESSION: 1. Ill-defined right-sided rectal mass correlating with the patient's known rectal cancer. No perirectal adenopathy or sigmoid mesocolon adenopathy. 2. Small/borderline iliac lymph nodes bilaterally as detailed above. There is also a 13 mm right inguinal lymph node. 3. No findings for hepatic metastatic disease and no retroperitoneal adenopathy. 4. No CT findings for pulmonary metastatic disease. 5. Four indeterminate renal lesions, most likely hyperdense cysts but will require further evaluation. Recommend MRI abdomen without and with contrast when able. 6. Colonic diverticulosis but no other colonic lesions are identified. 7. Multinodular thyroid goiter. Electronically Signed   By: Marijo Sanes M.D.   On: 08/11/2019 14:39   CT ABDOMEN PELVIS W CONTRAST  Result Date: 08/11/2019 CLINICAL DATA:  Rectal mass on colonoscopy today. EXAM: CT CHEST, ABDOMEN, AND PELVIS WITH CONTRAST TECHNIQUE: Multidetector CT imaging of the chest, abdomen and pelvis was performed following the standard protocol during bolus administration of intravenous contrast. CONTRAST:  23mL OMNIPAQUE IOHEXOL 300 MG/ML  SOLN COMPARISON:  None. FINDINGS: CT CHEST FINDINGS Cardiovascular: The heart is within normal limits in size for age. There is moderate left atrial enlargement noted. No pericardial effusion. Scattered aortic calcifications and coronary artery calcifications are noted. The pulmonary arteries appear grossly normal. Extensive left-sided chest wall collateral vessels are noted. I suspect there may be some stenosis of the left brachiocephalic vein due to the pacer wires. Mediastinum/Nodes: No mediastinal or hilar mass or adenopathy. The esophagus is  grossly normal. Lungs/Pleura: Emphysematous changes and fairly significant pulmonary scarring along with areas of bronchiectasis. No superimposed infiltrates or effusions. No worrisome pulmonary lesions. No pulmonary nodules to  suggest pulmonary metastatic disease. No pleural effusions. Chest wall/musculoskeletal: No breast masses, supraclavicular or axillary adenopathy. Enlarged thyroid gland with multiple thyroid nodules. None of these measures larger than 8 mm. This is likely multinodular goiter. CT ABDOMEN PELVIS FINDINGS Hepatobiliary: No focal hepatic lesions to suggest hepatic metastatic disease. No intra or extrahepatic biliary dilatation. The gallbladder appears normal. Pancreas: No mass, inflammation or ductal dilatation. Spleen: Normal in size. No focal lesions. Numerous calcified granulomas. Adrenals/Urinary Tract: The adrenal glands are unremarkable. Bilateral renal cysts are noted. 17 mm indeterminate lesion projecting off the midpole region of the left kidney posteriorly. This demonstrates high attenuation (68 Hounsfield units). It could be an enhancing solid renal lesion or a hemorrhagic cyst. Slightly more inferior and lateral to this lesion is a 15 mm lesion which measures 37 Hounsfield units and also could be a hemorrhagic cyst. 16 mm hyperdense lesion projecting off the medial cortex of the left kidney in the midpole region on image 58/3 is also indeterminate. Hyperdense lesion projecting off the midpole region of the right kidney laterally measures 73 Hounsfield units and is indeterminate. The bladder is unremarkable. Stomach/Bowel: The stomach, duodenum and small bowel are unremarkable. No acute inflammatory changes, mass lesions or obstructive findings. Diffuse colonic diverticulosis without findings for acute diverticulitis. Ill-defined right-sided rectal wall thickening consistent with patient's known rectal cancer. No obvious perirectal involvement. No perirectal adenopathy or pelvic sidewall adenopathy the. No enlarged nodes in the sigmoid mesocolon. Vascular/Lymphatic: Advanced atherosclerotic calcifications involving the aorta iliac arteries but no aneurysm or dissection. The branch vessels are patent. The  major venous structures are patent. No mesenteric or retroperitoneal lymphadenopathy. Small medial and lateral external iliac artery lymph nodes are noted. 7 mm lymph node on image 99/3. 5 mm left-sided node on image 99/3. 5 mm right lateral external iliac node on image 98/3. 11 mm right common femoral node on image 104/3. Left lateral external iliac node on image 100/3. 13 mm right inguinal lymph node on image number 111/3. Reproductive: The uterus is surgically absent. Both ovaries are still present and appear normal. Other: No free pelvic fluid collections. Small periumbilical abdominal wall hernia containing fat. Musculoskeletal: No significant bony findings. IMPRESSION: 1. Ill-defined right-sided rectal mass correlating with the patient's known rectal cancer. No perirectal adenopathy or sigmoid mesocolon adenopathy. 2. Small/borderline iliac lymph nodes bilaterally as detailed above. There is also a 13 mm right inguinal lymph node. 3. No findings for hepatic metastatic disease and no retroperitoneal adenopathy. 4. No CT findings for pulmonary metastatic disease. 5. Four indeterminate renal lesions, most likely hyperdense cysts but will require further evaluation. Recommend MRI abdomen without and with contrast when able. 6. Colonic diverticulosis but no other colonic lesions are identified. 7. Multinodular thyroid goiter. Electronically Signed   By: Marijo Sanes M.D.   On: 08/11/2019 14:39   Scheduled Meds: . atorvastatin  20 mg Oral QHS  . diltiazem  20 mg Intravenous Once  . enoxaparin (LOVENOX) injection  30 mg Subcutaneous Q24H  . insulin aspart  0-15 Units Subcutaneous TID WC  . insulin aspart  0-5 Units Subcutaneous QHS   Continuous Infusions: . diltiazem (CARDIZEM) infusion     PRN Meds:.acetaminophen **OR** acetaminophen  ASSESMENT:   *    Hematochezia, rectal bleeding. 08/11/2019 colonoscopy with malignant appearing rectal mass.  3 polyps removed with cold  snare.  Sigmoid  diverticulosis.  Blood seen throughout the colon not explained by rectal mass but may have concomitant diverticular bleed. 08/11/2019 CTAP of chest, abdomen, pelvis with contrast.  Confirms rectal mass corresponding with findings of colonoscopy.  Small/borderline iliac lymphadenopathy and 13 mm right inguinal lymph node.  No mets to liver.  No retroperitoneal adenopathy.  Renal lesions likely are hyperdense cysts but will require further evaluation.  Colonic diverticulosis.  Multinodular goiter.  *   Anemia.  Hgb 10.2 >> 8.5.  No PRBCs thus far.  *     Thrombocytopenia.  *   Tachycardia.  Looks to be SVT.  Mitral regurgitation.  CKD 3.  Diet controlled DM.  CHF.  Pacemaker in situ.   PLAN   *   Surgical and oncology consults.    *   Note that she is receiving Lovenox.  Would have low threshold to discontinue this and use PAS hose for DVT prophylaxis if she continues to have rectal bleeding.  *    Await pathology report  *   Soon as appropriate would get her up ambulating.  She has not been walking since she got here.  *  GI will sign off.      Azucena Freed  08/12/2019, 11:13 AM Phone [phone]

## 2019-08-12 NOTE — Progress Notes (Signed)
   08/12/19 0735  MEWS Score  ECG Heart Rate (!) 150 (RN [place] )  MEWS Score  MEWS Temp 0  MEWS Systolic 0  MEWS Pulse 3  MEWS RR 0  MEWS LOC 0  MEWS Score 3  MEWS Score Color Yellow  MEWS Assessment  Is this an acute change? Yes  MEWS guidelines implemented *See [place]  Provider Notification  Provider Name/Title Eulogio Bear, DO  Date Provider Notified 08/12/19  Time Provider Notified [phone] (tele notified RN at approximately 0900)  Notification Type Page  Notification Reason Other (Comment) (HR 150 per tele)  Response See new orders  Date of Provider Response 08/12/19  Time of Provider Response [phone]

## 2019-08-12 NOTE — Progress Notes (Signed)
HR 160s. Eulogio Bear, DO paged. See new orders. Rapid Response RN, Lexine Baton, notified to start cardizem drip per Eulogio Bear, DO. Will continue to monitor.

## 2019-08-12 NOTE — Anesthesia Postprocedure Evaluation (Signed)
Anesthesia Post Note  Patient: Candice Lynn  Procedure(s) Performed: COLONOSCOPY WITH PROPOFOL (N/A ) POLYPECTOMY BIOPSY     Patient location during evaluation: Endoscopy Anesthesia Type: MAC Level of consciousness: awake Pain management: pain level controlled Vital Signs Assessment: post-procedure vital signs reviewed and stable Respiratory status: spontaneous breathing Cardiovascular status: stable Postop Assessment: no apparent nausea or vomiting Anesthetic complications: no    Last Vitals:  Vitals:   08/12/19 1309 08/12/19 1334  BP: 130/71 118/71  Pulse: (!) 101 89  Resp:  (!) 24  Temp:  37.2 C  SpO2:  100%    Last Pain:  Vitals:   08/12/19 1340  TempSrc:   PainSc: 0-No pain   Pain Goal:                   Huston Foley

## 2019-08-12 NOTE — Plan of Care (Signed)
  Problem: Education: Goal: Knowledge of General Education information will improve Description: Including pain rating scale, medication(s)/side effects and non-pharmacologic comfort measures Outcome: Progressing   Problem: Health Behavior/Discharge Planning: Goal: Ability to manage health-related needs will improve Outcome: Progressing   Problem: Clinical Measurements: Goal: Ability to maintain clinical measurements within normal limits will improve Outcome: Progressing Goal: Cardiovascular complication will be avoided Outcome: Progressing   Problem: Nutrition: Goal: Adequate nutrition will be maintained Outcome: Progressing   Problem: Pain Managment: Goal: General experience of comfort will improve Outcome: Progressing   Problem: Safety: Goal: Ability to remain free from injury will improve Outcome: Progressing   Problem: Skin Integrity: Goal: Risk for impaired skin integrity will decrease Outcome: Progressing   

## 2019-08-12 NOTE — Progress Notes (Signed)
Pt's sister, Hyman Hopes, called for update on pt status. All questions answered to satisfaction.

## 2019-08-12 NOTE — Progress Notes (Addendum)
Progress Note    Francese Shank  [id number] DOB: 1941-06-01  DOA: 08/09/2019 PCP: System, Pcp Not In    Brief Narrative:     Medical records reviewed and are as summarized below:  Candice Lynn is an 79 y.o. female with medical history significant of CHF, diet-controlled type 2 diabetes, hypertension, hyperlipidemia, paroxysmal atrial tachycardia, severe mitral regurgitation, CKD stage III presenting to the ED to be evaluated after she noticed bright red blood in the toilet bowl.  Patient states she had rectal bleeding all day yesterday.  She is noticing bright red blood per rectum.  She has never had a gastrointestinal bleed before.  She is not on any blood thinners.  She has not vomited blood and does not take any over-the-counter NSAIDs.  No abdominal pain.  She has never had a colonoscopy done before.  Here visiting her sister, from the [place] area.  Patient says she is going back to [place] but have been unable to confirm with her family.     Assessment/Plan:   Principal Problem:   Lower GI bleed Active Problems:   CKD (chronic kidney disease), stage III   CHF (congestive heart failure) (HCC)   Type 2 diabetes mellitus (HCC)   HTN (hypertension)   Hematochezia   Hematuria   Rectal mass   Benign neoplasm of ascending colon   Benign neoplasm of transverse colon   Benign neoplasm of sigmoid colon   Rectal mass with possible right-sided diverticulosis causing lower GI bleeding -s/p colonoscopy:   -Rectal mass 0 to 3 cm from the anal verge.  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  - Three 3 to 4 mm polyps in the sigmoid colon, at  the hepatic flexure and in the ascending colon,  removed with a cold snare. Resected and retrieved.  - Diverticulosis in the sigmoid colon, in the  distal descending colon, in the ascending colon and   in the cecum.  - Blood in the entire examined colon. Rectal tumor  has definitely been bleeding, but given blood in   the right colon, I cannot exclude a concomitant right-sided diverticular bleeding. No active  bleeding found in the right colon during this exam -pathology pending -s/p CT chest/abd/pelvis:  1.Ill-defined right-sided rectal mass correlating with the patient's known rectal cancer. No perirectal adenopathy or sigmoid mesocolon adenopathy. 2. Small/borderline iliac lymph nodes bilaterally as detailed above. There is also a 13 mm right inguinal lymph node. 3. No findings for hepatic metastatic disease and no retroperitoneal adenopathy. 4. No CT findings for pulmonary metastatic disease. 5. Four indeterminate renal lesions, most likely hyperdense cysts but will require further evaluation. Recommend MRI abdomen without and with contrast when able. 6. Colonic diverticulosis but no other colonic lesions are identified. 7. Multinodular thyroid goiter.  Tachycardia -per chart review: History of syncope due to ventricular standstill, complete heart block, and is status post dual chamber pacemaker 2017. She also has PAT.  She was seen by [place] in the office on 05/13/2019 on hospital follow-up without cardiac complaint. She was ordered to keep a log of daily weights, blood pressure and heart rate and bring that to this appointment. She was taken off Lasix secondary to AKI and dehydration felt to be contributing to her syncope. She was found to be in an atrial tachycardia. Her Lopressor was increased to 100 b.i.d. from 50 b.i.d. and she was taken off diltiazem with a 1 week follow-up for EKG which was not performed. She  was also recommended to undergo a TEE which was performed on 05/26/2019 and that was not performed.  She is accompanied today by staff from the nursing home. She denies any complaints but is a very difficult historian. When asked if she is having any symptoms basically shrugs her shoulders and says I don't think so. She denies chest pain, pressure, SOB, DOE, dizziness, syncope, orthopnea or lower  extremity edema. She actually says that her lower extremity edema is much better. She was hospitalized at [facility] from 05/20/2019 until 05/25/2019 and since returning has been doing well without problems. Her vital signs are stable. She is tolerating her medications without complication. We discussed proceeding with the TEE for further evaluation of her severe mitral valve regurgitation and she is agreeable to proceed. We have scheduled this for 06/07/2019 with a follow-up with the structural heart team on 06/13/2019. She is to notify us with any problems or concerns prior to that appointment.  -check TSH -will start PO lopressor -IV cardizem and gtt until controlled  Hematuria -bladder unremarkable on CT scan but will need MRI of abd w/wo contrast for renal lesions  CKD stage IIIa Stable.  Creatinine 1.7, was 1.5 in July 2020 Per care everywhere. -Continue monitor renal function -resume lasix when able -has nephrologist in [place]  Chronic combined systolic and diastolic congestive heart failure Appears euvolemic at this time. -Hold diuretic  Diet-controlled type 2 diabetes -SSI -hgbA1c: 5.6  Hypertension -on lasix at home -resume her Cardizem CD  Hyperlipidemia -Continue Lipitor  obesity Body mass index is 33.2 kg/m.   Family Communication/Anticipated D/C date and plan/Code Status   DVT prophylaxis: lovenox Code Status: Full Code.  Family Communication: my NP called the number listed for sister and got the patient's phone Disposition Plan: path pending of rectal mass-- course complicated as her regular MDs and home is in [place] [employer]:    GI     Subjective:   This AM HR went to 150 at rest-- patient with no symptoms  Objective:    Vitals:   08/11/19 2222 08/12/19 0208 08/12/19 0529 08/12/19 1000  BP: (!) 143/68 140/66 (!) 144/76 (!) 147/101  Pulse: 93 89 79 (!) 149  Resp: (!) 24 (!) 22 18 20   Temp: 99.1 F (37.3 C) 98.4 F  (36.9 C) (!) 97.3 F (36.3 C) 98.5 F (36.9 C)  TempSrc: Oral Oral Oral Oral  SpO2: 100% 100% 100% 100%  Weight:      Height:        Intake/Output Summary (Last 24 hours) at 08/12/2019 1050 Last data filed at 08/12/2019 1000 Gross per 24 hour  Intake 240 ml  Output 1300 ml  Net -1060 ml   Filed Weights   08/09/19 1912  Weight: 77.1 kg    Exam: In bed Pleasant and cooperative Tachy at 150-- did not break with bearing down +BS, soft, NT Alert   Data Reviewed:   I have personally reviewed following labs and imaging studies:  Labs: Labs show the following:   Basic Metabolic Panel: Recent Labs  Lab 08/09/19 1322 08/09/19 1322 08/10/19 0738 08/10/19 0738 08/11/19 0150 08/12/19 0352  NA 142  --  143  --  145 139  K 4.0   < > 3.5   < > 5.3* 4.1  CL 107  --  111  --  113* 108  CO2 28  --  25  --  19* 22  GLUCOSE 145*  --  87  --  106* 91  BUN 33*  --  24*  --  14 18  CREATININE 1.71*  --  1.28*  --  1.21* 1.22*  CALCIUM 9.2  --  8.3*  --  8.8* 8.4*   < > = values in this interval not displayed.   GFR Estimated Creatinine Clearance: 35.4 mL/min (A) (by C-G formula based on SCr of 1.22 mg/dL (H)). Liver Function Tests: Recent Labs  Lab 08/09/19 1322  AST 23  ALT 16  ALKPHOS 87  BILITOT 0.6  PROT 7.1  ALBUMIN 3.3*   No results for input(s): LIPASE, AMYLASE in the last 168 hours. No results for input(s): AMMONIA in the last 168 hours. Coagulation profile No results for input(s): INR, PROTIME in the last 168 hours.  CBC: Recent Labs  Lab 08/09/19 1322 08/09/19 1322 08/09/19 1914 08/09/19 1914 08/09/19 2241 08/10/19 0738 08/10/19 1200 08/11/19 0150 08/12/19 0352  WBC 6.7  --  6.3  --  6.1  --   --  7.9 6.6  HGB 11.8*   < > 18.9*   < > 10.2* 10.3* 9.9* 11.0* 8.5*  HCT 36.6   < > 57.5*   < > 31.4* 32.2* 31.7* 34.1* 26.2*  MCV 91.5  --  89.3  --  91.0  --   --  91.2 91.0  PLT 158  --  QUESTIONABLE RESULTS, RECOMMEND RECOLLECT TO VERIFY  --  141*   --   --  142* 130*   < > = values in this interval not displayed.   Cardiac Enzymes: No results for input(s): CKTOTAL, CKMB, CKMBINDEX, TROPONINI in the last 168 hours. BNP (last 3 results) No results for input(s): PROBNP in the last 8760 hours. CBG: Recent Labs  Lab 08/11/19 0443 08/11/19 1121 08/11/19 1714 08/11/19 2224 08/12/19 0805  GLUCAP 105* 92 167* 91 86   D-Dimer: No results for input(s): DDIMER in the last 72 hours. Hgb A1c: Recent Labs    08/10/19 1200  HGBA1C 5.6   Lipid Profile: No results for input(s): CHOL, HDL, LDLCALC, TRIG, CHOLHDL, LDLDIRECT in the last 72 hours. Thyroid function studies: No results for input(s): TSH, T4TOTAL, T3FREE, THYROIDAB in the last 72 hours.  Invalid input(s): FREET3 Anemia work up: No results for input(s): VITAMINB12, FOLATE, FERRITIN, TIBC, IRON, RETICCTPCT in the last 72 hours. Sepsis Labs: Recent Labs  Lab 08/09/19 1914 08/09/19 2241 08/11/19 0150 08/12/19 0352  WBC 6.3 6.1 7.9 6.6    Microbiology Recent Results (from the past 240 hour(s))  Urine culture     Status: Abnormal   Collection Time: 08/09/19  6:43 PM   Specimen: Urine, Random  Result Value Ref Range Status   Specimen Description URINE, RANDOM  Final   Special Requests   Final    NONE Performed at [facility], [place] [address], [place], [place] [address]    Culture 20,000 COLONIES/mL ESCHERICHIA COLI (A)  Final   Report Status 08/12/2019 FINAL  Final   Organism ID, Bacteria ESCHERICHIA COLI (A)  Final      Susceptibility   Escherichia coli - MIC*    AMPICILLIN <=2 SENSITIVE Sensitive     CEFAZOLIN <=4 SENSITIVE Sensitive     CEFTRIAXONE <=0.25 SENSITIVE Sensitive     CIPROFLOXACIN >=4 RESISTANT Resistant     GENTAMICIN <=1 SENSITIVE Sensitive     IMIPENEM <=0.25 SENSITIVE Sensitive     NITROFURANTOIN <=16 SENSITIVE Sensitive     TRIMETH/SULFA <=20 SENSITIVE Sensitive     AMPICILLIN/SULBACTAM <=2 SENSITIVE  Sensitive     PIP/TAZO <=4  SENSITIVE Sensitive     * 20,000 COLONIES/mL ESCHERICHIA COLI  SARS CORONAVIRUS 2 (TAT 6-24 HRS) Nasopharyngeal Nasopharyngeal Swab     Status: None   Collection Time: 08/09/19  7:11 PM   Specimen: Nasopharyngeal Swab  Result Value Ref Range Status   SARS Coronavirus 2 NEGATIVE NEGATIVE Final    Comment: (NOTE) SARS-CoV-2 target nucleic acids are NOT DETECTED. The SARS-CoV-2 RNA is generally detectable in upper and lower respiratory specimens during the acute phase of infection. Negative results do not preclude SARS-CoV-2 infection, do not rule out co-infections with other pathogens, and should not be used as the sole basis for treatment or other patient management decisions. Negative results must be combined with clinical observations, patient history, and epidemiological information. The expected result is Negative. Fact Sheet for Patients: SugarRoll.be Fact Sheet for Healthcare Providers: https://www.woods-mathews.com/ This test is not yet approved or cleared by the [place] FDA and  has been authorized for detection and/or diagnosis of SARS-CoV-2 by FDA under an Emergency Use Authorization (EUA). This EUA will remain  in effect (meaning this test can be used) for the duration of the COVID-19 declaration under Section 56 4(b)(1) of the Act, 21 U.S.C. section 360bbb-3(b)(1), unless the authorization is terminated or revoked sooner. Performed at [facility], [place] [address] [place], [place] [address]     Procedures and diagnostic studies:  CT CHEST W CONTRAST  Result Date: 08/11/2019 CLINICAL DATA:  Rectal mass on colonoscopy today. EXAM: CT CHEST, ABDOMEN, AND PELVIS WITH CONTRAST TECHNIQUE: Multidetector CT imaging of the chest, abdomen and pelvis was performed following the standard protocol during bolus administration of intravenous contrast. CONTRAST:  45mL OMNIPAQUE IOHEXOL 300 MG/ML  SOLN COMPARISON:  None. FINDINGS: CT  CHEST FINDINGS Cardiovascular: The heart is within normal limits in size for age. There is moderate left atrial enlargement noted. No pericardial effusion. Scattered aortic calcifications and coronary artery calcifications are noted. The pulmonary arteries appear grossly normal. Extensive left-sided chest wall collateral vessels are noted. I suspect there may be some stenosis of the left brachiocephalic vein due to the pacer wires. Mediastinum/Nodes: No mediastinal or hilar mass or adenopathy. The esophagus is grossly normal. Lungs/Pleura: Emphysematous changes and fairly significant pulmonary scarring along with areas of bronchiectasis. No superimposed infiltrates or effusions. No worrisome pulmonary lesions. No pulmonary nodules to suggest pulmonary metastatic disease. No pleural effusions. Chest wall/musculoskeletal: No breast masses, supraclavicular or axillary adenopathy. Enlarged thyroid gland with multiple thyroid nodules. None of these measures larger than 8 mm. This is likely multinodular goiter. CT ABDOMEN PELVIS FINDINGS Hepatobiliary: No focal hepatic lesions to suggest hepatic metastatic disease. No intra or extrahepatic biliary dilatation. The gallbladder appears normal. Pancreas: No mass, inflammation or ductal dilatation. Spleen: Normal in size. No focal lesions. Numerous calcified granulomas. Adrenals/Urinary Tract: The adrenal glands are unremarkable. Bilateral renal cysts are noted. 17 mm indeterminate lesion projecting off the midpole region of the left kidney posteriorly. This demonstrates high attenuation (68 Hounsfield units). It could be an enhancing solid renal lesion or a hemorrhagic cyst. Slightly more inferior and lateral to this lesion is a 15 mm lesion which measures 37 Hounsfield units and also could be a hemorrhagic cyst. 16 mm hyperdense lesion projecting off the medial cortex of the left kidney in the midpole region on image 58/3 is also indeterminate. Hyperdense lesion projecting  off the midpole region of the right kidney laterally measures 73 Hounsfield units and is indeterminate. The bladder  is unremarkable. Stomach/Bowel: The stomach, duodenum and small bowel are unremarkable. No acute inflammatory changes, mass lesions or obstructive findings. Diffuse colonic diverticulosis without findings for acute diverticulitis. Ill-defined right-sided rectal wall thickening consistent with patient's known rectal cancer. No obvious perirectal involvement. No perirectal adenopathy or pelvic sidewall adenopathy the. No enlarged nodes in the sigmoid mesocolon. Vascular/Lymphatic: Advanced atherosclerotic calcifications involving the aorta iliac arteries but no aneurysm or dissection. The branch vessels are patent. The major venous structures are patent. No mesenteric or retroperitoneal lymphadenopathy. Small medial and lateral external iliac artery lymph nodes are noted. 7 mm lymph node on image 99/3. 5 mm left-sided node on image 99/3. 5 mm right lateral external iliac node on image 98/3. 11 mm right common femoral node on image 104/3. Left lateral external iliac node on image 100/3. 13 mm right inguinal lymph node on image number 111/3. Reproductive: The uterus is surgically absent. Both ovaries are still present and appear normal. Other: No free pelvic fluid collections. Small periumbilical abdominal wall hernia containing fat. Musculoskeletal: No significant bony findings. IMPRESSION: 1. Ill-defined right-sided rectal mass correlating with the patient's known rectal cancer. No perirectal adenopathy or sigmoid mesocolon adenopathy. 2. Small/borderline iliac lymph nodes bilaterally as detailed above. There is also a 13 mm right inguinal lymph node. 3. No findings for hepatic metastatic disease and no retroperitoneal adenopathy. 4. No CT findings for pulmonary metastatic disease. 5. Four indeterminate renal lesions, most likely hyperdense cysts but will require further evaluation. Recommend MRI abdomen  without and with contrast when able. 6. Colonic diverticulosis but no other colonic lesions are identified. 7. Multinodular thyroid goiter. Electronically Signed   By: Marijo Sanes M.D.   On: 08/11/2019 14:39   CT ABDOMEN PELVIS W CONTRAST  Result Date: 08/11/2019 CLINICAL DATA:  Rectal mass on colonoscopy today. EXAM: CT CHEST, ABDOMEN, AND PELVIS WITH CONTRAST TECHNIQUE: Multidetector CT imaging of the chest, abdomen and pelvis was performed following the standard protocol during bolus administration of intravenous contrast. CONTRAST:  27mL OMNIPAQUE IOHEXOL 300 MG/ML  SOLN COMPARISON:  None. FINDINGS: CT CHEST FINDINGS Cardiovascular: The heart is within normal limits in size for age. There is moderate left atrial enlargement noted. No pericardial effusion. Scattered aortic calcifications and coronary artery calcifications are noted. The pulmonary arteries appear grossly normal. Extensive left-sided chest wall collateral vessels are noted. I suspect there may be some stenosis of the left brachiocephalic vein due to the pacer wires. Mediastinum/Nodes: No mediastinal or hilar mass or adenopathy. The esophagus is grossly normal. Lungs/Pleura: Emphysematous changes and fairly significant pulmonary scarring along with areas of bronchiectasis. No superimposed infiltrates or effusions. No worrisome pulmonary lesions. No pulmonary nodules to suggest pulmonary metastatic disease. No pleural effusions. Chest wall/musculoskeletal: No breast masses, supraclavicular or axillary adenopathy. Enlarged thyroid gland with multiple thyroid nodules. None of these measures larger than 8 mm. This is likely multinodular goiter. CT ABDOMEN PELVIS FINDINGS Hepatobiliary: No focal hepatic lesions to suggest hepatic metastatic disease. No intra or extrahepatic biliary dilatation. The gallbladder appears normal. Pancreas: No mass, inflammation or ductal dilatation. Spleen: Normal in size. No focal lesions. Numerous calcified  granulomas. Adrenals/Urinary Tract: The adrenal glands are unremarkable. Bilateral renal cysts are noted. 17 mm indeterminate lesion projecting off the midpole region of the left kidney posteriorly. This demonstrates high attenuation (68 Hounsfield units). It could be an enhancing solid renal lesion or a hemorrhagic cyst. Slightly more inferior and lateral to this lesion is a 15 mm lesion which measures 37 Hounsfield units and also  could be a hemorrhagic cyst. 16 mm hyperdense lesion projecting off the medial cortex of the left kidney in the midpole region on image 58/3 is also indeterminate. Hyperdense lesion projecting off the midpole region of the right kidney laterally measures 73 Hounsfield units and is indeterminate. The bladder is unremarkable. Stomach/Bowel: The stomach, duodenum and small bowel are unremarkable. No acute inflammatory changes, mass lesions or obstructive findings. Diffuse colonic diverticulosis without findings for acute diverticulitis. Ill-defined right-sided rectal wall thickening consistent with patient's known rectal cancer. No obvious perirectal involvement. No perirectal adenopathy or pelvic sidewall adenopathy the. No enlarged nodes in the sigmoid mesocolon. Vascular/Lymphatic: Advanced atherosclerotic calcifications involving the aorta iliac arteries but no aneurysm or dissection. The branch vessels are patent. The major venous structures are patent. No mesenteric or retroperitoneal lymphadenopathy. Small medial and lateral external iliac artery lymph nodes are noted. 7 mm lymph node on image 99/3. 5 mm left-sided node on image 99/3. 5 mm right lateral external iliac node on image 98/3. 11 mm right common femoral node on image 104/3. Left lateral external iliac node on image 100/3. 13 mm right inguinal lymph node on image number 111/3. Reproductive: The uterus is surgically absent. Both ovaries are still present and appear normal. Other: No free pelvic fluid collections. Small  periumbilical abdominal wall hernia containing fat. Musculoskeletal: No significant bony findings. IMPRESSION: 1. Ill-defined right-sided rectal mass correlating with the patient's known rectal cancer. No perirectal adenopathy or sigmoid mesocolon adenopathy. 2. Small/borderline iliac lymph nodes bilaterally as detailed above. There is also a 13 mm right inguinal lymph node. 3. No findings for hepatic metastatic disease and no retroperitoneal adenopathy. 4. No CT findings for pulmonary metastatic disease. 5. Four indeterminate renal lesions, most likely hyperdense cysts but will require further evaluation. Recommend MRI abdomen without and with contrast when able. 6. Colonic diverticulosis but no other colonic lesions are identified. 7. Multinodular thyroid goiter. Electronically Signed   By: Marijo Sanes M.D.   On: 08/11/2019 14:39    Medications:   . atorvastatin  20 mg Oral QHS  . diltiazem  180 mg Oral Daily  . insulin aspart  0-15 Units Subcutaneous TID WC  . insulin aspart  0-5 Units Subcutaneous QHS   Continuous Infusions:   LOS: 2 days   Geradine Girt  [facility]   How to contact the [facility] Attending or Consulting provider [place] or covering provider during after hours [place], for this patient?  1. Check the care team in [facility] and look for a) attending/consulting [facility] provider listed and b) the [facility] team listed 2. Log into [email or web address] and use [place]'s universal password to access. If you do not have the password, please contact the hospital operator. 3. Locate the [facility] provider you are looking for under [facility] and page to a number that you can be directly reached. 4. If you still have difficulty reaching the provider, please page the [facility] (Director on Call) for the Hospitalists listed on amion for assistance.  08/12/2019, 10:50 AM

## 2019-08-13 LAB — CBC
HCT: 27.1 % — ABNORMAL LOW (ref 36.0–46.0)
Hemoglobin: 9.1 g/dL — ABNORMAL LOW (ref 12.0–15.0)
MCH: 30.1 pg (ref 26.0–34.0)
MCHC: 33.6 g/dL (ref 30.0–36.0)
MCV: 89.7 fL (ref 80.0–100.0)
Platelets: 137 10*3/uL — ABNORMAL LOW (ref 150–400)
RBC: 3.02 MIL/uL — ABNORMAL LOW (ref 3.87–5.11)
RDW: 17 % — ABNORMAL HIGH (ref 11.5–15.5)
WBC: 7.1 10*3/uL (ref 4.0–10.5)
nRBC: 0 % (ref 0.0–0.2)

## 2019-08-13 LAB — GLUCOSE, CAPILLARY
Glucose-Capillary: 105 mg/dL — ABNORMAL HIGH (ref 70–99)
Glucose-Capillary: 106 mg/dL — ABNORMAL HIGH (ref 70–99)
Glucose-Capillary: 109 mg/dL — ABNORMAL HIGH (ref 70–99)
Glucose-Capillary: 88 mg/dL (ref 70–99)
Glucose-Capillary: 93 mg/dL (ref 70–99)
Glucose-Capillary: 96 mg/dL (ref 70–99)
Glucose-Capillary: 99 mg/dL (ref 70–99)

## 2019-08-13 LAB — BASIC METABOLIC PANEL
Anion gap: 6 (ref 5–15)
BUN: 22 mg/dL (ref 8–23)
CO2: 26 mmol/L (ref 22–32)
Calcium: 8.4 mg/dL — ABNORMAL LOW (ref 8.9–10.3)
Chloride: 106 mmol/L (ref 98–111)
Creatinine, Ser: 1.45 mg/dL — ABNORMAL HIGH (ref 0.44–1.00)
GFR calc Af Amer: 40 mL/min — ABNORMAL LOW (ref >60)
GFR calc non Af Amer: 35 mL/min — ABNORMAL LOW (ref >60)
Glucose, Bld: 97 mg/dL (ref 70–99)
Potassium: 4 mmol/L (ref 3.5–5.1)
Sodium: 138 mmol/L (ref 135–145)

## 2019-08-13 NOTE — Progress Notes (Signed)
Progress Note    Candice Lynn  [id number] DOB: [birth year]  DOA: 08/09/2019 PCP: System, Pcp Not In    Brief Narrative:     Medical records reviewed and are as summarized below:  Candice Lynn is an 79 y.o. female with medical history significant of CHF, diet-controlled type 2 diabetes, hypertension, hyperlipidemia, paroxysmal atrial tachycardia, severe mitral regurgitation, CKD stage III presenting to the ED to be evaluated after she noticed bright red blood in the toilet bowl.  Patient states she had rectal bleeding all day yesterday.  She is noticing bright red blood per rectum.  She has never had a gastrointestinal bleed before.  She is not on any blood thinners.  She has not vomited blood and does not take any over-the-counter NSAIDs.  No abdominal pain.  She has never had a colonoscopy done before.  Here visiting her sister, from the [place] area.  Patient says she is going back to [place] but have been unable to confirm with her family.     Assessment/Plan:   Principal Problem:   Lower GI bleed Active Problems:   CKD (chronic kidney disease), stage III   CHF (congestive heart failure) (HCC)   Type 2 diabetes mellitus (HCC)   HTN (hypertension)   Hematochezia   Hematuria   Rectal mass   Benign neoplasm of ascending colon   Benign neoplasm of transverse colon   Benign neoplasm of sigmoid colon   Rectal mass with possible right-sided diverticulosis causing lower GI bleeding -s/p colonoscopy:   -Rectal mass 0 to 3 cm from the anal verge.  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  - Three 3 to 4 mm polyps in the sigmoid colon, at  the hepatic flexure and in the ascending colon,  removed with a cold snare. Resected and retrieved.  - Diverticulosis in the sigmoid colon, in the  distal descending colon, in the ascending colon and   in the cecum.  - Blood in the entire examined colon. Rectal tumor  has definitely been bleeding, but given blood in   the right colon, I cannot exclude a concomitant right-sided diverticular bleeding. No active  bleeding found in the right colon during this exam -pathology pending -s/p CT chest/abd/pelvis:  1.Ill-defined right-sided rectal mass correlating with the patient's known rectal cancer. No perirectal adenopathy or sigmoid mesocolon adenopathy. 2. Small/borderline iliac lymph nodes bilaterally as detailed above. There is also a 13 mm right inguinal lymph node. 3. No findings for hepatic metastatic disease and no retroperitoneal adenopathy. 4. No CT findings for pulmonary metastatic disease. 5. Four indeterminate renal lesions, most likely hyperdense cysts but will require further evaluation. Recommend MRI abdomen without and with contrast when able. 6. Colonic diverticulosis but no other colonic lesions are identified. 7. Multinodular thyroid goiter. -once pathology back, plan to get oncology and surgical consult  Tachycardia -per chart review: History of syncope due to ventricular standstill, complete heart block, and is status post dual chamber pacemaker 2017. She also has PAT.  She was seen by [place] in the office on 05/13/2019 on hospital follow-up without cardiac complaint. She was ordered to keep a log of daily weights, blood pressure and heart rate and bring that to this appointment. She was taken off Lasix secondary to AKI and dehydration felt to be contributing to her syncope. She was found to be in an atrial tachycardia. Her Lopressor was increased to 100 b.i.d. from 50 b.i.d. and she was taken off diltiazem with a  1 week follow-up for EKG which was not performed. She was also recommended to undergo a TEE which was performed on 05/26/2019 and that was not performed.  She is accompanied today by staff from the nursing home. She denies any complaints but is a very difficult historian. When asked if she is having any symptoms basically shrugs her shoulders and says I don't think so. She denies chest  pain, pressure, SOB, DOE, dizziness, syncope, orthopnea or lower extremity edema. She actually says that her lower extremity edema is much better. She was hospitalized at [facility] from 05/20/2019 until 05/25/2019 and since returning has been doing well without problems. Her vital signs are stable. She is tolerating her medications without complication. We discussed proceeding with the TEE for further evaluation of her severe mitral valve regurgitation and she is agreeable to proceed. We have scheduled this for 06/07/2019 with a follow-up with the structural heart team on 06/13/2019. She is to notify us with any problems or concerns prior to that appointment.  -TSH normal (sister said she used to be on a supplement but when she came to stay with her, she was no longer on it) -will start PO lopressor  Hematuria -bladder unremarkable on CT scan but will need MRI of abd w/wo contrast for renal lesions when able  CKD stage IIIa Stable.  Creatinine 1.7, was 1.5 in July 2020 Per care everywhere. -Continue monitor renal function -resume lasix when able -has nephrologist in [place]  Chronic combined systolic and diastolic congestive heart failure Appears euvolemic at this time. -Hold diuretic  Diet-controlled type 2 diabetes -SSI -hgbA1c: 5.6  Hypertension -on lasix at home -resume her metoprolol  Hyperlipidemia -Continue Lipitor  obesity Body mass index is 34.01 kg/m.   Family Communication/Anticipated D/C date and plan/Code Status   DVT prophylaxis: scd Code Status: Full Code.  Family Communication: spoke with sister-- patient does not appear to understand or remember Disposition Plan: path pending of rectal mass-- course complicated as her regular MDs and home is in [place] [facility]:    GI     Subjective:   This AM HR went to 150 at rest-- patient with no symptoms  Objective:    Vitals:   08/13/19 0737 08/13/19 0742 08/13/19 1123 08/13/19  1129  BP: 139/67  138/71   Pulse: 69  70   Resp: 20  (!) 24   Temp:  98.6 F (37 C)  98.1 F (36.7 C)  TempSrc:  Oral  Oral  SpO2: 98%  99%   Weight:      Height:        Intake/Output Summary (Last 24 hours) at 08/13/2019 1242 Last data filed at 08/13/2019 1126 Gross per 24 hour  Intake 1080 ml  Output 750 ml  Net 330 ml   Filed Weights   08/09/19 1912 08/12/19 1334 08/13/19 0046  Weight: 77.1 kg 77.4 kg 79 kg    Exam: In bed, sleeping but easily awoken Appears not to remember discussions from day to day rrr No increased work of breathing Min LE edema   Data Reviewed:   I have personally reviewed following labs and imaging studies:  Labs: Labs show the following:   Basic Metabolic Panel: Recent Labs  Lab 08/09/19 1322 08/09/19 1322 08/10/19 0738 08/10/19 0738 08/11/19 0150 08/11/19 0150 08/12/19 0352 08/13/19 0536  NA 142  --  143  --  145  --  139 138  K 4.0   < > 3.5   < >  5.3*   < > 4.1 4.0  CL 107  --  111  --  113*  --  108 106  CO2 28  --  25  --  19*  --  22 26  GLUCOSE 145*  --  87  --  106*  --  91 97  BUN 33*  --  24*  --  14  --  18 22  CREATININE 1.71*  --  1.28*  --  1.21*  --  1.22* 1.45*  CALCIUM 9.2  --  8.3*  --  8.8*  --  8.4* 8.4*   < > = values in this interval not displayed.   GFR Estimated Creatinine Clearance: 30.2 mL/min (A) (by C-G formula based on SCr of 1.45 mg/dL (H)). Liver Function Tests: Recent Labs  Lab 08/09/19 1322  AST 23  ALT 16  ALKPHOS 87  BILITOT 0.6  PROT 7.1  ALBUMIN 3.3*   No results for input(s): LIPASE, AMYLASE in the last 168 hours. No results for input(s): AMMONIA in the last 168 hours. Coagulation profile No results for input(s): INR, PROTIME in the last 168 hours.  CBC: Recent Labs  Lab 08/09/19 2241 08/10/19 0738 08/10/19 1200 08/11/19 0150 08/12/19 0352 08/12/19 1258 08/13/19 0536  WBC 6.1  --   --  7.9 6.6 9.3 7.1  HGB 10.2*   < > 9.9* 11.0* 8.5* 10.0* 9.1*  HCT 31.4*   < > 31.7*  34.1* 26.2* 30.4* 27.1*  MCV 91.0  --   --  91.2 91.0 89.7 89.7  PLT 141*  --   --  142* 130* 163 137*   < > = values in this interval not displayed.   Cardiac Enzymes: No results for input(s): CKTOTAL, CKMB, CKMBINDEX, TROPONINI in the last 168 hours. BNP (last 3 results) No results for input(s): PROBNP in the last 8760 hours. CBG: Recent Labs  Lab 08/12/19 2036 08/13/19 0043 08/13/19 0402 08/13/19 0737 08/13/19 1124  GLUCAP 134* 105* 96 88 99   D-Dimer: No results for input(s): DDIMER in the last 72 hours. Hgb A1c: No results for input(s): HGBA1C in the last 72 hours. Lipid Profile: No results for input(s): CHOL, HDL, LDLCALC, TRIG, CHOLHDL, LDLDIRECT in the last 72 hours. Thyroid function studies: Recent Labs    08/12/19 1056  TSH 2.914   Anemia work up: No results for input(s): VITAMINB12, FOLATE, FERRITIN, TIBC, IRON, RETICCTPCT in the last 72 hours. Sepsis Labs: Recent Labs  Lab 08/11/19 0150 08/12/19 0352 08/12/19 1258 08/13/19 0536  WBC 7.9 6.6 9.3 7.1    Microbiology Recent Results (from the past 240 hour(s))  Urine culture     Status: Abnormal   Collection Time: 08/09/19  6:43 PM   Specimen: Urine, Random  Result Value Ref Range Status   Specimen Description URINE, RANDOM  Final   Special Requests   Final    NONE Performed at [facility], [address] [place], [place] [address]    Culture 20,000 COLONIES/mL ESCHERICHIA COLI (A)  Final   Report Status 08/12/2019 FINAL  Final   Organism ID, Bacteria ESCHERICHIA COLI (A)  Final      Susceptibility   Escherichia coli - MIC*    AMPICILLIN <=2 SENSITIVE Sensitive     CEFAZOLIN <=4 SENSITIVE Sensitive     CEFTRIAXONE <=0.25 SENSITIVE Sensitive     CIPROFLOXACIN >=4 RESISTANT Resistant     GENTAMICIN <=1 SENSITIVE Sensitive     IMIPENEM <=0.25 SENSITIVE Sensitive  NITROFURANTOIN <=16 SENSITIVE Sensitive     TRIMETH/SULFA <=20 SENSITIVE Sensitive     AMPICILLIN/SULBACTAM <=2 SENSITIVE  Sensitive     PIP/TAZO <=4 SENSITIVE Sensitive     * 20,000 COLONIES/mL ESCHERICHIA COLI  SARS CORONAVIRUS 2 (TAT 6-24 HRS) Nasopharyngeal Nasopharyngeal Swab     Status: None   Collection Time: 08/09/19  7:11 PM   Specimen: Nasopharyngeal Swab  Result Value Ref Range Status   SARS Coronavirus 2 NEGATIVE NEGATIVE Final    Comment: (NOTE) SARS-CoV-2 target nucleic acids are NOT DETECTED. The SARS-CoV-2 RNA is generally detectable in upper and lower respiratory specimens during the acute phase of infection. Negative results do not preclude SARS-CoV-2 infection, do not rule out co-infections with other pathogens, and should not be used as the sole basis for treatment or other patient management decisions. Negative results must be combined with clinical observations, patient history, and epidemiological information. The expected result is Negative. Fact Sheet for Patients: SugarRoll.be Fact Sheet for Healthcare Providers: https://www.woods-mathews.com/ This test is not yet approved or cleared by the [place] FDA and  has been authorized for detection and/or diagnosis of SARS-CoV-2 by FDA under an Emergency Use Authorization (EUA). This EUA will remain  in effect (meaning this test can be used) for the duration of the COVID-19 declaration under Section 56 4(b)(1) of the Act, 21 U.S.C. section 360bbb-3(b)(1), unless the authorization is terminated or revoked sooner. Performed at [facility], [place] [address], [place], [place] [address]     Procedures and diagnostic studies:  CT CHEST W CONTRAST  Result Date: 08/11/2019 CLINICAL DATA:  Rectal mass on colonoscopy today. EXAM: CT CHEST, ABDOMEN, AND PELVIS WITH CONTRAST TECHNIQUE: Multidetector CT imaging of the chest, abdomen and pelvis was performed following the standard protocol during bolus administration of intravenous contrast. CONTRAST:  42mL OMNIPAQUE IOHEXOL 300 MG/ML  SOLN  COMPARISON:  None. FINDINGS: CT CHEST FINDINGS Cardiovascular: The heart is within normal limits in size for age. There is moderate left atrial enlargement noted. No pericardial effusion. Scattered aortic calcifications and coronary artery calcifications are noted. The pulmonary arteries appear grossly normal. Extensive left-sided chest wall collateral vessels are noted. I suspect there may be some stenosis of the left brachiocephalic vein due to the pacer wires. Mediastinum/Nodes: No mediastinal or hilar mass or adenopathy. The esophagus is grossly normal. Lungs/Pleura: Emphysematous changes and fairly significant pulmonary scarring along with areas of bronchiectasis. No superimposed infiltrates or effusions. No worrisome pulmonary lesions. No pulmonary nodules to suggest pulmonary metastatic disease. No pleural effusions. Chest wall/musculoskeletal: No breast masses, supraclavicular or axillary adenopathy. Enlarged thyroid gland with multiple thyroid nodules. None of these measures larger than 8 mm. This is likely multinodular goiter. CT ABDOMEN PELVIS FINDINGS Hepatobiliary: No focal hepatic lesions to suggest hepatic metastatic disease. No intra or extrahepatic biliary dilatation. The gallbladder appears normal. Pancreas: No mass, inflammation or ductal dilatation. Spleen: Normal in size. No focal lesions. Numerous calcified granulomas. Adrenals/Urinary Tract: The adrenal glands are unremarkable. Bilateral renal cysts are noted. 17 mm indeterminate lesion projecting off the midpole region of the left kidney posteriorly. This demonstrates high attenuation (68 Hounsfield units). It could be an enhancing solid renal lesion or a hemorrhagic cyst. Slightly more inferior and lateral to this lesion is a 15 mm lesion which measures 37 Hounsfield units and also could be a hemorrhagic cyst. 16 mm hyperdense lesion projecting off the medial cortex of the left kidney in the midpole region on image 58/3 is also  indeterminate. Hyperdense lesion  projecting off the midpole region of the right kidney laterally measures 73 Hounsfield units and is indeterminate. The bladder is unremarkable. Stomach/Bowel: The stomach, duodenum and small bowel are unremarkable. No acute inflammatory changes, mass lesions or obstructive findings. Diffuse colonic diverticulosis without findings for acute diverticulitis. Ill-defined right-sided rectal wall thickening consistent with patient's known rectal cancer. No obvious perirectal involvement. No perirectal adenopathy or pelvic sidewall adenopathy the. No enlarged nodes in the sigmoid mesocolon. Vascular/Lymphatic: Advanced atherosclerotic calcifications involving the aorta iliac arteries but no aneurysm or dissection. The branch vessels are patent. The major venous structures are patent. No mesenteric or retroperitoneal lymphadenopathy. Small medial and lateral external iliac artery lymph nodes are noted. 7 mm lymph node on image 99/3. 5 mm left-sided node on image 99/3. 5 mm right lateral external iliac node on image 98/3. 11 mm right common femoral node on image 104/3. Left lateral external iliac node on image 100/3. 13 mm right inguinal lymph node on image number 111/3. Reproductive: The uterus is surgically absent. Both ovaries are still present and appear normal. Other: No free pelvic fluid collections. Small periumbilical abdominal wall hernia containing fat. Musculoskeletal: No significant bony findings. IMPRESSION: 1. Ill-defined right-sided rectal mass correlating with the patient's known rectal cancer. No perirectal adenopathy or sigmoid mesocolon adenopathy. 2. Small/borderline iliac lymph nodes bilaterally as detailed above. There is also a 13 mm right inguinal lymph node. 3. No findings for hepatic metastatic disease and no retroperitoneal adenopathy. 4. No CT findings for pulmonary metastatic disease. 5. Four indeterminate renal lesions, most likely hyperdense cysts but will  require further evaluation. Recommend MRI abdomen without and with contrast when able. 6. Colonic diverticulosis but no other colonic lesions are identified. 7. Multinodular thyroid goiter. Electronically Signed   By: Marijo Sanes M.D.   On: 08/11/2019 14:39   CT ABDOMEN PELVIS W CONTRAST  Result Date: 08/11/2019 CLINICAL DATA:  Rectal mass on colonoscopy today. EXAM: CT CHEST, ABDOMEN, AND PELVIS WITH CONTRAST TECHNIQUE: Multidetector CT imaging of the chest, abdomen and pelvis was performed following the standard protocol during bolus administration of intravenous contrast. CONTRAST:  36mL OMNIPAQUE IOHEXOL 300 MG/ML  SOLN COMPARISON:  None. FINDINGS: CT CHEST FINDINGS Cardiovascular: The heart is within normal limits in size for age. There is moderate left atrial enlargement noted. No pericardial effusion. Scattered aortic calcifications and coronary artery calcifications are noted. The pulmonary arteries appear grossly normal. Extensive left-sided chest wall collateral vessels are noted. I suspect there may be some stenosis of the left brachiocephalic vein due to the pacer wires. Mediastinum/Nodes: No mediastinal or hilar mass or adenopathy. The esophagus is grossly normal. Lungs/Pleura: Emphysematous changes and fairly significant pulmonary scarring along with areas of bronchiectasis. No superimposed infiltrates or effusions. No worrisome pulmonary lesions. No pulmonary nodules to suggest pulmonary metastatic disease. No pleural effusions. Chest wall/musculoskeletal: No breast masses, supraclavicular or axillary adenopathy. Enlarged thyroid gland with multiple thyroid nodules. None of these measures larger than 8 mm. This is likely multinodular goiter. CT ABDOMEN PELVIS FINDINGS Hepatobiliary: No focal hepatic lesions to suggest hepatic metastatic disease. No intra or extrahepatic biliary dilatation. The gallbladder appears normal. Pancreas: No mass, inflammation or ductal dilatation. Spleen: Normal in  size. No focal lesions. Numerous calcified granulomas. Adrenals/Urinary Tract: The adrenal glands are unremarkable. Bilateral renal cysts are noted. 17 mm indeterminate lesion projecting off the midpole region of the left kidney posteriorly. This demonstrates high attenuation (68 Hounsfield units). It could be an enhancing solid renal lesion or a hemorrhagic cyst. Slightly  more inferior and lateral to this lesion is a 15 mm lesion which measures 37 Hounsfield units and also could be a hemorrhagic cyst. 16 mm hyperdense lesion projecting off the medial cortex of the left kidney in the midpole region on image 58/3 is also indeterminate. Hyperdense lesion projecting off the midpole region of the right kidney laterally measures 73 Hounsfield units and is indeterminate. The bladder is unremarkable. Stomach/Bowel: The stomach, duodenum and small bowel are unremarkable. No acute inflammatory changes, mass lesions or obstructive findings. Diffuse colonic diverticulosis without findings for acute diverticulitis. Ill-defined right-sided rectal wall thickening consistent with patient's known rectal cancer. No obvious perirectal involvement. No perirectal adenopathy or pelvic sidewall adenopathy the. No enlarged nodes in the sigmoid mesocolon. Vascular/Lymphatic: Advanced atherosclerotic calcifications involving the aorta iliac arteries but no aneurysm or dissection. The branch vessels are patent. The major venous structures are patent. No mesenteric or retroperitoneal lymphadenopathy. Small medial and lateral external iliac artery lymph nodes are noted. 7 mm lymph node on image 99/3. 5 mm left-sided node on image 99/3. 5 mm right lateral external iliac node on image 98/3. 11 mm right common femoral node on image 104/3. Left lateral external iliac node on image 100/3. 13 mm right inguinal lymph node on image number 111/3. Reproductive: The uterus is surgically absent. Both ovaries are still present and appear normal. Other: No  free pelvic fluid collections. Small periumbilical abdominal wall hernia containing fat. Musculoskeletal: No significant bony findings. IMPRESSION: 1. Ill-defined right-sided rectal mass correlating with the patient's known rectal cancer. No perirectal adenopathy or sigmoid mesocolon adenopathy. 2. Small/borderline iliac lymph nodes bilaterally as detailed above. There is also a 13 mm right inguinal lymph node. 3. No findings for hepatic metastatic disease and no retroperitoneal adenopathy. 4. No CT findings for pulmonary metastatic disease. 5. Four indeterminate renal lesions, most likely hyperdense cysts but will require further evaluation. Recommend MRI abdomen without and with contrast when able. 6. Colonic diverticulosis but no other colonic lesions are identified. 7. Multinodular thyroid goiter. Electronically Signed   By: Marijo Sanes M.D.   On: 08/11/2019 14:39    Medications:   . atorvastatin  20 mg Oral QHS  . enoxaparin (LOVENOX) injection  30 mg Subcutaneous Q24H  . insulin aspart  0-15 Units Subcutaneous TID WC  . insulin aspart  0-5 Units Subcutaneous QHS  . metoprolol tartrate  50 mg Oral BID   Continuous Infusions:   LOS: 3 days   Geradine Girt  [facility]   How to contact the [facility] Attending or Consulting provider [place] or covering provider during after hours [place], for this patient?  1. Check the care team in [facility] and look for a) attending/consulting [facility] provider listed and b) the [facility] team listed 2. Log into [email or web address] and use [place]'s universal password to access. If you do not have the password, please contact the hospital operator. 3. Locate the [facility] provider you are looking for under [facility] and page to a number that you can be directly reached. 4. If you still have difficulty reaching the provider, please page the [facility] (Director on Call) for the Hospitalists listed on amion for assistance.  08/13/2019, 12:42 PM

## 2019-08-14 DIAGNOSIS — N3000 Acute cystitis without hematuria: Secondary | ICD-10-CM

## 2019-08-14 LAB — GLUCOSE, CAPILLARY
Glucose-Capillary: 99 mg/dL (ref 70–99)
Glucose-Capillary: 98 mg/dL (ref 70–99)
Glucose-Capillary: 114 mg/dL — ABNORMAL HIGH (ref 70–99)
Glucose-Capillary: 117 mg/dL — ABNORMAL HIGH (ref 70–99)

## 2019-08-14 LAB — CBC
HCT: 27.8 % — ABNORMAL LOW (ref 36.0–46.0)
Hemoglobin: 9 g/dL — ABNORMAL LOW (ref 12.0–15.0)
MCH: 28.9 pg (ref 26.0–34.0)
MCHC: 32.4 g/dL (ref 30.0–36.0)
MCV: 89.4 fL (ref 80.0–100.0)
Platelets: 121 10*3/uL — ABNORMAL LOW (ref 150–400)
RBC: 3.11 MIL/uL — ABNORMAL LOW (ref 3.87–5.11)
RDW: 16.6 % — ABNORMAL HIGH (ref 11.5–15.5)
WBC: 6.6 10*3/uL (ref 4.0–10.5)
nRBC: 0 % (ref 0.0–0.2)

## 2019-08-14 LAB — BASIC METABOLIC PANEL
Anion gap: 9 (ref 5–15)
BUN: 26 mg/dL — ABNORMAL HIGH (ref 8–23)
CO2: 23 mmol/L (ref 22–32)
Calcium: 8.3 mg/dL — ABNORMAL LOW (ref 8.9–10.3)
Chloride: 108 mmol/L (ref 98–111)
Creatinine, Ser: 1.26 mg/dL — ABNORMAL HIGH (ref 0.44–1.00)
GFR calc Af Amer: 48 mL/min — ABNORMAL LOW (ref >60)
GFR calc non Af Amer: 41 mL/min — ABNORMAL LOW (ref >60)
Glucose, Bld: 91 mg/dL (ref 70–99)
Potassium: 4.1 mmol/L (ref 3.5–5.1)
Sodium: 140 mmol/L (ref 135–145)

## 2019-08-14 MED ORDER — POLYETHYLENE GLYCOL 3350 17 G PO PACK
17.0000 g | PACK | Freq: Every day | ORAL | Status: DC
  Administered 2019-08-14 – 2019-08-16 (×2): 17 g via ORAL
  Filled 2019-08-14 (×3): qty 1

## 2019-08-14 MED ORDER — OXYCODONE HCL 5 MG PO TABS
5.0000 mg | ORAL_TABLET | Freq: Four times a day (QID) | ORAL | Status: DC | PRN
  Administered 2019-08-14: 5 mg via ORAL
  Filled 2019-08-14: qty 1

## 2019-08-14 NOTE — Evaluation (Addendum)
Physical Therapy Evaluation Patient Details Name: Candice Lynn MRN: [id number] DOB: 08/08/1940 Today's Date: 08/14/2019   History of Present Illness  Patient is a 79 y/o female who presents with rectal bleeding. Admitted with rectal mass with possible right diverticulosis in setting of GI bleed. PMH includes HTN, DM, CKD, CHF, CAD and PAT.  Clinical Impression  Patient presents with generalized weakness, impaired cognition? and impaired mobility s/p above. Pt reports being Mod I PTA using RW for support. Pt from [place] but has been staying with her sister here since getting sick. Today, pt requires Mod A for bed mobility and Min guard assist for transfers and taking a few steps to get to chair. HR stable throughout with no SOB. Pt difficult historian today. Limited mobility session as pt wanting to eat breakfast. Encouraged walking with nursing. Pt will need to be able to negotiate steps if returning to sister's home. Will follow acutely to maximize independence and mobility prior to return home.    Follow Up Recommendations Home health PT;Supervision for mobility/OOB    Equipment Recommendations  None recommended by PT    Recommendations for Other Services       Precautions / Restrictions Precautions Precautions: Fall Restrictions Weight Bearing Restrictions: No      Mobility  Bed Mobility Overal bed mobility: Needs Assistance Bed Mobility: Supine to Sit     Supine to sit: Mod assist;HOB elevated     General bed mobility comments: Assist with trunk and bringing RLE to EOB.  Transfers Overall transfer level: Needs assistance Equipment used: Rolling walker (2 wheeled) Transfers: Sit to/from Stand Sit to Stand: Min guard         General transfer comment: Min guard for safety with pt pulling up on RW to stand.  Ambulation/Gait Ambulation/Gait assistance: Min guard Gait Distance (Feet): 4 Feet Assistive device: Rolling walker (2 wheeled) Gait  Pattern/deviations: Shuffle;Trunk flexed;Wide base of support Gait velocity: decreased   General Gait Details: Able to take a few steps to get to chair with RW for support; no evidence of knee buckling. Limited as pt wanting to eat breakfast.  Stairs            Wheelchair Mobility    Modified Rankin (Stroke Patients Only)       Balance Overall balance assessment: Needs assistance Sitting-balance support: Feet supported;No upper extremity supported Sitting balance-Leahy Scale: Fair     Standing balance support: During functional activity Standing balance-Leahy Scale: Poor Standing balance comment: Requires UE support in standing.                             Pertinent Vitals/Pain Pain Assessment: No/denies pain    Home Living Family/patient expects to be discharged to:: Private residence Living Arrangements: Alone Available Help at Discharge: Family Type of Home: House Home Access: Stairs to enter Entrance Stairs-Rails: Right Entrance Stairs-Number of Steps: 5 (to enter sisters home) Home Layout: Multi-level Home Equipment: Gilford Rile - 2 wheels;Bedside commode      Prior Function Level of Independence: Independent with assistive device(s)         Comments: Reports she has been staying wtih her sister since she has been sick. Originally from [place] but has no support there. Uses RW for ambulation. Cares for self but has been doing bird baths recently.     Hand Dominance        Extremity/Trunk Assessment   Upper Extremity Assessment Upper Extremity Assessment: Defer to OT  evaluation    Lower Extremity Assessment Lower Extremity Assessment: Generalized weakness(but functional)       Communication   Communication: No difficulties  Cognition Arousal/Alertness: Awake/alert Behavior During Therapy: WFL for tasks assessed/performed Overall Cognitive Status: No family/caregiver present to determine baseline cognitive functioning                                  General Comments: oriented to self, place and to date. Not able to state why she is here, "what's it called?" Follows commands.      General Comments General comments (skin integrity, edema, etc.): HR stable.    Exercises     Assessment/Plan    PT Assessment Patient needs continued PT services  PT Problem List Decreased strength;Decreased mobility;Decreased cognition       PT Treatment Interventions Therapeutic activities;Gait training;Therapeutic exercise;Patient/family education;Balance training;Functional mobility training;Stair training;Cognitive remediation    PT Goals (Current goals can be found in the Care Plan section)  Acute Rehab PT Goals Patient Stated Goal: to go back to my sister's house PT Goal Formulation: With patient Time For Goal Achievement: 08/28/19 Potential to Achieve Goals: Good    Frequency Min 3X/week   Barriers to discharge Decreased caregiver support      Co-evaluation               AM-PAC PT "6 Clicks" Mobility  Outcome Measure Help needed turning from your back to your side while in a flat bed without using bedrails?: A Little Help needed moving from lying on your back to sitting on the side of a flat bed without using bedrails?: A Lot Help needed moving to and from a bed to a chair (including a wheelchair)?: A Little Help needed standing up from a chair using your arms (e.g., wheelchair or bedside chair)?: A Little Help needed to walk in hospital room?: A Little Help needed climbing 3-5 steps with a railing? : A Lot 6 Click Score: 16    End of Session Equipment Utilized During Treatment: Gait belt Activity Tolerance: Patient tolerated treatment well Patient left: in chair;with call bell/phone within reach;with chair alarm set Nurse Communication: Mobility status PT Visit Diagnosis: Muscle weakness (generalized) (M62.81)    Time: [id number] PT Time Calculation (min) (ACUTE ONLY): 20  min   Charges:   PT Evaluation $PT Eval Moderate Complexity: 1 Mod          Marisa Severin, PT, DPT Acute Rehabilitation Services Pager [phone] Office [phone]      Marguarite Arbour A Sabra Heck 08/14/2019, 8:35 AM

## 2019-08-14 NOTE — Plan of Care (Signed)
  Problem: Education: Goal: Knowledge of General Education information will improve Description: Including pain rating scale, medication(s)/side effects and non-pharmacologic comfort measures Outcome: Progressing   Problem: Health Behavior/Discharge Planning: Goal: Ability to manage health-related needs will improve Outcome: Progressing   Problem: Clinical Measurements: Goal: Ability to maintain clinical measurements within normal limits will improve Outcome: Progressing Goal: Respiratory complications will improve Outcome: Progressing Goal: Cardiovascular complication will be avoided Outcome: Progressing   Problem: Activity: Goal: Risk for activity intolerance will decrease Outcome: Progressing   Problem: Nutrition: Goal: Adequate nutrition will be maintained Outcome: Progressing   Problem: Elimination: Goal: Will not experience complications related to bowel motility Outcome: Progressing Goal: Will not experience complications related to urinary retention Outcome: Progressing

## 2019-08-14 NOTE — Progress Notes (Signed)
[facility]  PROGRESS NOTE  Candice Lynn [id number] DOB: May 16, 1941 DOA: 08/09/2019 PCP: System, Pcp Not In   Brief HPI:   79 year old female with a history of CHF, diet-controlled type 2 diabetes mellitus, hypertension, hyperlipidemia, paroxysmal atrial tachycardia, severe mitral regurgitation, CKD stage III came to ED with complaints of bright red blood in toilet bowl.  Patient said that she had rectal bleeding all day yesterday.  She noticed bright red blood per rectum.  Never had GI bleed before.  Patient was visiting her sister from [place].  Patient had colonoscopy -  Rectal mass 0 to 3 cm from the anal verge. 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 - Three 3 to 4 mm polyps in the sigmoid colon, atthe hepatic flexure and in the ascending colon, removed with a cold snare. Resected and retrieved. - Diverticulosis in the sigmoid colon, in the distal descending colon, in the ascending colon and in the cecum. - Blood in the entire examined colon. Rectal tumor has definitely been bleeding, but given blood in the right colon, I cannot exclude a concomitant right-sided diverticular bleeding. No active bleeding found in the right colon during this exam  At this time GI is awaiting path report before further recommendations.   Subjective   Patient seen and examined, complains of pain in the lower back.   Assessment/Plan:     1. 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  Awaiting pathology results for further recommendations.  GI would like to have treatment started before discharge.  2. Sinus tachycardia-patient has history of syncope due to ventricular standstill, complete heart block, s/p 2 chamber pacemaker in 2017.  Currently on p.o. Lopressor.  3. Hematuria-urinary bladder is unremarkable on CT scan, but may need MRI of the abdomen with and without contrast for renal lesion, will await final  pathology report and will have to discuss with oncology.  4. CKD stage III a- stable, creatinine is 1.26.  It was 1.5 in July.  Lasix is currently on hold.  Patient is followed by nephrologist in [place].  5. Chronic combined systolic and diastolic CHF-stable, euvolemic.  Lasix is on hold.  6. Diabetes mellitus type 2-diet controlled, continue sliding scale insulin with NovoLog.  7. Hypertension-continue metoprolol, Lasix is on hold.  8. Hyperlipidemia-continue Lipitor.    SpO2: 100 % O2 Flow Rate (L/min): 0 L/min   COVID-19 Labs  No results for input(s): DDIMER, FERRITIN, LDH, CRP in the last 72 hours.  Lab Results  Component Value Date   [place] NEGATIVE 08/09/2019     CBG: Recent Labs  Lab 08/13/19 0737 08/13/19 1124 08/13/19 1601 08/13/19 2002 08/14/19 0610  GLUCAP 88 99 109* 106* 99    CBC: Recent Labs  Lab 08/11/19 0150 08/12/19 0352 08/12/19 1258 08/13/19 0536 08/14/19 0539  WBC 7.9 6.6 9.3 7.1 6.6  HGB 11.0* 8.5* 10.0* 9.1* 9.0*  HCT 34.1* 26.2* 30.4* 27.1* 27.8*  MCV 91.2 91.0 89.7 89.7 89.4  PLT 142* 130* 163 137* 121*    Basic Metabolic Panel: Recent Labs  Lab 08/10/19 0738 08/11/19 0150 08/12/19 0352 08/13/19 0536 08/14/19 0539  NA 143 145 139 138 140  K 3.5 5.3* 4.1 4.0 4.1  CL 111 113* 108 106 108  CO2 25 19* 22 26 23   GLUCOSE 87 106* 91 97 91  BUN 24* 14 18 22  26*  CREATININE 1.28* 1.21* 1.22* 1.45* 1.26*  CALCIUM 8.3* 8.8* 8.4* 8.4* 8.3*     Liver  Function Tests: Recent Labs  Lab 08/09/19 1322  AST 23  ALT 16  ALKPHOS 87  BILITOT 0.6  PROT 7.1  ALBUMIN 3.3*        DVT prophylaxis: SCDs   Code Status: Full code  Family Communication: No family at bedside  Disposition Plan: likely home when medically ready for discharge         Scheduled medications:  . atorvastatin  20 mg Oral QHS  . insulin aspart  0-15 Units Subcutaneous TID WC  . insulin aspart  0-5 Units Subcutaneous QHS  . metoprolol  tartrate  50 mg Oral BID  . polyethylene glycol  17 g Oral Daily    Consultants:  Gastroenterology  Procedures:  Colonoscopy  Antibiotics:   Anti-infectives (From admission, onward)   Start     Dose/Rate Route Frequency Ordered Stop   08/10/19 2300  cefTRIAXone (ROCEPHIN) 1 g in sodium chloride 0.9 % 100 mL IVPB  Status:  Discontinued     1 g 200 mL/hr over 30 Minutes Intravenous Every 24 hours 08/10/19 0301 08/10/19 1320   08/09/19 2100  cefTRIAXone (ROCEPHIN) 1 g in sodium chloride 0.9 % 100 mL IVPB     1 g 200 mL/hr over 30 Minutes Intravenous  Once 08/09/19 2046 08/10/19 0046       Objective   Vitals:   08/13/19 1601 08/13/19 1939 08/14/19 0450 08/14/19 0933  BP: (!) 129/49 134/65 (!) 144/68 139/75  Pulse: 70 74 70 79  Resp: 19 18 18    Temp: 98.4 F (36.9 C) 98.3 F (36.8 C) 98.5 F (36.9 C)   TempSrc: Oral Oral Oral   SpO2: 100% 100% 100%   Weight:   78.2 kg   Height:        Intake/Output Summary (Last 24 hours) at 08/14/2019 1126 Last data filed at 08/14/2019 [id number] Gross per 24 hour  Intake 720 ml  Output 400 ml  Net 320 ml    01/15 1901 - 01/17 0700 In: 1320 [P.O.:1320] Out: 850 [Urine:850]  Filed Weights   08/12/19 1334 08/13/19 0046 08/14/19 0450  Weight: 77.4 kg 79 kg 78.2 kg    Physical Examination:   General-appears in no acute distress Heart-S1-S2, regular, no murmur auscultated Lungs-clear to auscultation bilaterally, no wheezing or crackles auscultated Abdomen-soft, nontender, no organomegaly Extremities-no edema in the lower extremities Neuro-alert, oriented x3, no focal deficit noted   Data Reviewed:   Recent Results (from the past 240 hour(s))  Urine culture     Status: Abnormal   Collection Time: 08/09/19  6:43 PM   Specimen: Urine, Random  Result Value Ref Range Status   Specimen Description URINE, RANDOM  Final   Special Requests   Final    NONE Performed at [facility], [place] [address], [place], [place]  [address]    Culture 20,000 COLONIES/mL ESCHERICHIA COLI (A)  Final   Report Status 08/12/2019 FINAL  Final   Organism ID, Bacteria ESCHERICHIA COLI (A)  Final      Susceptibility   Escherichia coli - MIC*    AMPICILLIN <=2 SENSITIVE Sensitive     CEFAZOLIN <=4 SENSITIVE Sensitive     CEFTRIAXONE <=0.25 SENSITIVE Sensitive     CIPROFLOXACIN >=4 RESISTANT Resistant     GENTAMICIN <=1 SENSITIVE Sensitive     IMIPENEM <=0.25 SENSITIVE Sensitive     NITROFURANTOIN <=16 SENSITIVE Sensitive     TRIMETH/SULFA <=20 SENSITIVE Sensitive     AMPICILLIN/SULBACTAM <=2 SENSITIVE Sensitive     PIP/TAZO <=  4 SENSITIVE Sensitive     * 20,000 COLONIES/mL ESCHERICHIA COLI  SARS CORONAVIRUS 2 (TAT 6-24 HRS) Nasopharyngeal Nasopharyngeal Swab     Status: None   Collection Time: 08/09/19  7:11 PM   Specimen: Nasopharyngeal Swab  Result Value Ref Range Status   SARS Coronavirus 2 NEGATIVE NEGATIVE Final    Comment: (NOTE) SARS-CoV-2 target nucleic acids are NOT DETECTED. The SARS-CoV-2 RNA is generally detectable in upper and lower respiratory specimens during the acute phase of infection. Negative results do not preclude SARS-CoV-2 infection, do not rule out co-infections with other pathogens, and should not be used as the sole basis for treatment or other patient management decisions. Negative results must be combined with clinical observations, patient history, and epidemiological information. The expected result is Negative. Fact Sheet for Patients: SugarRoll.be Fact Sheet for Healthcare Providers: https://www.woods-mathews.com/ This test is not yet approved or cleared by the [place] FDA and  has been authorized for detection and/or diagnosis of SARS-CoV-2 by FDA under an Emergency Use Authorization (EUA). This EUA will remain  in effect (meaning this test can be used) for the duration of the COVID-19 declaration under Section 56 4(b)(1) of the Act, 21  U.S.C. section 360bbb-3(b)(1), unless the authorization is terminated or revoked sooner. Performed at [facility], [place] [address], [place], [place] [address]        Admission status: Inpatient: Based on patients clinical presentation and evaluation of above clinical data, I have made determination that patient meets Inpatient criteria at this time.   Oswald Hillock   [facility] If 7PM-7AM, please contact night-coverage at [email or web address], Office  [phone]  password [email or web address]  08/14/2019, 11:26 AM  LOS: 4 days

## 2019-08-14 NOTE — Plan of Care (Signed)

## 2019-08-15 ENCOUNTER — Encounter (HOSPITAL_COMMUNITY): Payer: Self-pay | Admitting: Internal Medicine

## 2019-08-15 DIAGNOSIS — I5030 Unspecified diastolic (congestive) heart failure: Secondary | ICD-10-CM

## 2019-08-15 DIAGNOSIS — I10 Essential (primary) hypertension: Secondary | ICD-10-CM

## 2019-08-15 LAB — GLUCOSE, CAPILLARY
Glucose-Capillary: 75 mg/dL (ref 70–99)
Glucose-Capillary: 84 mg/dL (ref 70–99)
Glucose-Capillary: 84 mg/dL (ref 70–99)
Glucose-Capillary: 98 mg/dL (ref 70–99)

## 2019-08-15 LAB — CBC
HCT: 29.9 % — ABNORMAL LOW (ref 36.0–46.0)
Hemoglobin: 9.8 g/dL — ABNORMAL LOW (ref 12.0–15.0)
MCH: 29.5 pg (ref 26.0–34.0)
MCHC: 32.8 g/dL (ref 30.0–36.0)
MCV: 90.1 fL (ref 80.0–100.0)
Platelets: 123 10*3/uL — ABNORMAL LOW (ref 150–400)
RBC: 3.32 MIL/uL — ABNORMAL LOW (ref 3.87–5.11)
RDW: 16.8 % — ABNORMAL HIGH (ref 11.5–15.5)
WBC: 6.6 10*3/uL (ref 4.0–10.5)
nRBC: 0.3 % — ABNORMAL HIGH (ref 0.0–0.2)

## 2019-08-15 LAB — COMPREHENSIVE METABOLIC PANEL
ALT: 16 U/L (ref 0–44)
AST: 28 U/L (ref 15–41)
Albumin: 2.6 g/dL — ABNORMAL LOW (ref 3.5–5.0)
Alkaline Phosphatase: 67 U/L (ref 38–126)
Anion gap: 7 (ref 5–15)
BUN: 32 mg/dL — ABNORMAL HIGH (ref 8–23)
CO2: 26 mmol/L (ref 22–32)
Calcium: 8.5 mg/dL — ABNORMAL LOW (ref 8.9–10.3)
Chloride: 107 mmol/L (ref 98–111)
Creatinine, Ser: 1.52 mg/dL — ABNORMAL HIGH (ref 0.44–1.00)
GFR calc Af Amer: 38 mL/min — ABNORMAL LOW (ref >60)
GFR calc non Af Amer: 33 mL/min — ABNORMAL LOW (ref >60)
Glucose, Bld: 86 mg/dL (ref 70–99)
Potassium: 5.4 mmol/L — ABNORMAL HIGH (ref 3.5–5.1)
Sodium: 140 mmol/L (ref 135–145)
Total Bilirubin: 0.5 mg/dL (ref 0.3–1.2)
Total Protein: 5.9 g/dL — ABNORMAL LOW (ref 6.5–8.1)

## 2019-08-15 LAB — TSH: TSH: 5.376 u[IU]/mL — ABNORMAL HIGH (ref 0.350–4.500)

## 2019-08-15 LAB — SURGICAL PATHOLOGY

## 2019-08-15 LAB — T4, FREE: Free T4: 0.81 ng/dL (ref 0.61–1.12)

## 2019-08-15 NOTE — Progress Notes (Signed)
Patient's sister Gregary Signs was provided an update on the patient's current condition, patient gave permission to do so.

## 2019-08-15 NOTE — Plan of Care (Signed)

## 2019-08-15 NOTE — Progress Notes (Addendum)
PROGRESS NOTE    Candice Lynn  [id number]  DOB: September 10, 1940  PCP: System, Pcp Not In Admit date:08/09/2019  79 year old female with a history of CHF, diet-controlled type 2 diabetes mellitus, hypertension, hyperlipidemia, paroxysmal atrial tachycardia, severe mitral regurgitation, CKD stage III came to ED with complaints of rectal bleeding x 1 day.Patient was visiting her sister from [place]. ED Course: Afebrile,labs showed creat 1.71 (prev 1.4), hgb 11.8 Hospital course: Patient admitted to [facility] for GI evaluation. Hgb dropped to lowest 8.5 during hospital course (I/15). Patient underwent colonoscopy showing Rectal mass in the distal rectum. Biopsied. Also noted to have 3 to 4 mm polyps x3  in the sigmoid colon, atthe hepatic flexure and in the ascending colon, removed with a cold snare. Diverticulosis- Blood in the entire examined colon. Rectal tumor has definitely been bleeding, but given blood in the right colon, GI could not exclude a concomitant right-sided diverticular bleeding. No active bleeding found in the right colon during this exam. At this time GI is awaiting path report before further recommendations.  Subjective:  Resting comfortably, no acute c/o. Tolearting diet with pain/rectal bleeding Objective: Vitals:   08/14/19 0933 08/14/19 1422 08/14/19 2000 08/15/19 0441  BP: 139/75 121/65 132/65 133/64  Pulse: 79 65 63 68  Resp:  20 18 18   Temp:  98.2 F (36.8 C) 98.2 F (36.8 C) 98 F (36.7 C)  TempSrc:  Oral Oral Oral  SpO2:   100% 100%  Weight:    79 kg  Height:        Intake/Output Summary (Last 24 hours) at 08/15/2019 0826 Last data filed at 08/15/2019 0700 Gross per 24 hour  Intake 240 ml  Output 1325 ml  Net -1085 ml   Filed Weights   08/13/19 0046 08/14/19 0450 08/15/19 0441  Weight: 79 kg 78.2 kg 79 kg    Physical Examination:  General exam: Appears calm and comfortable . No obvious goitre Respiratory system: Clear to  auscultation. Respiratory effort normal. Cardiovascular system: S1 & S2 heard, ++systolic murmur. No pedal edema. Gastrointestinal system: Abdomen is nondistended, soft and nontender. Normal bowel sounds heard. Central nervous system: Alert and oriented. No new focal neurological deficits. Extremities: No contractures, edema or joint deformities.  Skin: No rashes, lesions or ulcers Psychiatry: Judgement and insight appear normal. Mood & affect appropriate.   Data Reviewed: I have personally reviewed following labs and imaging studies  CBC: Recent Labs  Lab 08/12/19 0352 08/12/19 1258 08/13/19 0536 08/14/19 0539 08/15/19 0429  WBC 6.6 9.3 7.1 6.6 6.6  HGB 8.5* 10.0* 9.1* 9.0* 9.8*  HCT 26.2* 30.4* 27.1* 27.8* 29.9*  MCV 91.0 89.7 89.7 89.4 90.1  PLT 130* 163 137* 121* [id number]*   Basic Metabolic Panel: Recent Labs  Lab 08/11/19 0150 08/12/19 0352 08/13/19 0536 08/14/19 0539 08/15/19 0429  NA 145 139 138 140 140  K 5.3* 4.1 4.0 4.1 5.4*  CL 113* 108 106 108 107  CO2 19* 22 26 23 26   GLUCOSE 106* 91 97 91 86  BUN 14 18 22  26* 32*  CREATININE 1.21* 1.22* 1.45* 1.26* 1.52*  CALCIUM 8.8* 8.4* 8.4* 8.3* 8.5*   GFR: Estimated Creatinine Clearance: 28.8 mL/min (A) (by C-G formula based on SCr of 1.52 mg/dL (H)). Liver Function Tests: Recent Labs  Lab 08/09/19 1322 08/15/19 0429  AST 23 28  ALT 16 16  ALKPHOS 87 67  BILITOT 0.6 0.5  PROT 7.1 5.9*  ALBUMIN 3.3* 2.6*   No results for input(s):  LIPASE, AMYLASE in the last 168 hours. No results for input(s): AMMONIA in the last 168 hours. Coagulation Profile: No results for input(s): INR, PROTIME in the last 168 hours. Cardiac Enzymes: No results for input(s): CKTOTAL, CKMB, CKMBINDEX, TROPONINI in the last 168 hours. BNP (last 3 results) No results for input(s): PROBNP in the last 8760 hours. HbA1C: No results for input(s): HGBA1C in the last 72 hours. CBG: Recent Labs  Lab 08/14/19 0610 08/14/19 1146 08/14/19 1558  08/14/19 2136 08/15/19 0616  GLUCAP 99 98 114* 117* 75   Lipid Profile: No results for input(s): CHOL, HDL, LDLCALC, TRIG, CHOLHDL, LDLDIRECT in the last 72 hours. Thyroid Function Tests: Recent Labs    08/12/19 1056  TSH 2.914   Anemia Panel: No results for input(s): VITAMINB12, FOLATE, FERRITIN, TIBC, IRON, RETICCTPCT in the last 72 hours. Sepsis Labs: No results for input(s): PROCALCITON, LATICACIDVEN in the last 168 hours.  Recent Results (from the past 240 hour(s))  Urine culture     Status: Abnormal   Collection Time: 08/09/19  6:43 PM   Specimen: Urine, Random  Result Value Ref Range Status   Specimen Description URINE, RANDOM  Final   Special Requests   Final    NONE Performed at [facility], [address], [place], [place] [address]    Culture 20,000 COLONIES/mL ESCHERICHIA COLI (A)  Final   Report Status 08/12/2019 FINAL  Final   Organism ID, Bacteria ESCHERICHIA COLI (A)  Final      Susceptibility   Escherichia coli - MIC*    AMPICILLIN <=2 SENSITIVE Sensitive     CEFAZOLIN <=4 SENSITIVE Sensitive     CEFTRIAXONE <=0.25 SENSITIVE Sensitive     CIPROFLOXACIN >=4 RESISTANT Resistant     GENTAMICIN <=1 SENSITIVE Sensitive     IMIPENEM <=0.25 SENSITIVE Sensitive     NITROFURANTOIN <=16 SENSITIVE Sensitive     TRIMETH/SULFA <=20 SENSITIVE Sensitive     AMPICILLIN/SULBACTAM <=2 SENSITIVE Sensitive     PIP/TAZO <=4 SENSITIVE Sensitive     * 20,000 COLONIES/mL ESCHERICHIA COLI  SARS CORONAVIRUS 2 (TAT 6-24 HRS) Nasopharyngeal Nasopharyngeal Swab     Status: None   Collection Time: 08/09/19  7:11 PM   Specimen: Nasopharyngeal Swab  Result Value Ref Range Status   SARS Coronavirus 2 NEGATIVE NEGATIVE Final    Comment: (NOTE) SARS-CoV-2 target nucleic acids are NOT DETECTED. The SARS-CoV-2 RNA is generally detectable in upper and lower respiratory specimens during the acute phase of infection. Negative results do not preclude SARS-CoV-2 infection, do not  rule out co-infections with other pathogens, and should not be used as the sole basis for treatment or other patient management decisions. Negative results must be combined with clinical observations, patient history, and epidemiological information. The expected result is Negative. Fact Sheet for Patients: SugarRoll.be Fact Sheet for Healthcare Providers: https://www.woods-mathews.com/ This test is not yet approved or cleared by the [place] FDA and  has been authorized for detection and/or diagnosis of SARS-CoV-2 by FDA under an Emergency Use Authorization (EUA). This EUA will remain  in effect (meaning this test can be used) for the duration of the COVID-19 declaration under Section 56 4(b)(1) of the Act, 21 U.S.C. section 360bbb-3(b)(1), unless the authorization is terminated or revoked sooner. Performed at [facility], [place] [address] [place], [place] [address]       Radiology Studies: No results found.      Scheduled Meds: . atorvastatin  20 mg Oral QHS  . insulin  aspart  0-15 Units Subcutaneous TID WC  . insulin aspart  0-5 Units Subcutaneous QHS  . metoprolol tartrate  50 mg Oral BID  . polyethylene glycol  17 g Oral Daily   Continuous Infusions:  Assessment & Plan:   Rectal mass with diverticulosis in sigmoid colon-s/p colonoscopy, biopsy obtained.  Awaiting pathology results for further recommendations.  GI would like to have treatment started before discharge. CT chest /abd /pelvis did not reveal any pulmonary/hepatic mets but reported Four indeterminate renal lesions, most likely hyperdense cysts , may require further evaluation with MRI abdomen without and with contrast when able.  Sinus tachycardia-patient has history of syncope due to ventricular standstill, complete heart block, s/p 2 chamber pacemaker in 2017.  Currently on p.o. Lopressor. 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  Will check thyroid profile  Hematuria-urinary bladder is unremarkable on CT scan, but may need MRI of the abdomen with and without contrast for renal lesion, will await final pathology report and will have to discuss with oncology.  AKI on CKD stage III a- POA likely secondary to volume loss/GIB. Baseline Cr ~1.4. Was at 1.7 on presentation, now improved and close to baseline.  Lasix is currently on hold.  Patient is followed by a nephrologist in [place].  Chronic combined systolic and diastolic CHF-stable, euvolemic. Lasix is on hold.  Diabetes mellitus type 2-diet controlled, continue sliding scale insulin with NovoLog.  Hypertension-continue metoprolol, Lasix is on hold.  Hyperlipidemia-continue Lipitor.     DVT prophylaxis: SCD Code Status: Full code Family / Patient Communication: d/w patient Disposition Plan: home when medically clear     LOS: 5 days    Time spent: 25 minutes    Guilford Shi, MD [facility] Pager [phone]  If 7PM-7AM, please contact night-coverage [email or web address] Password [facility] 08/15/2019, 8:26 AM

## 2019-08-15 NOTE — Consult Note (Addendum)
[place]  [phone] [phone]   MEDICAL ONCOLOGY - INITIAL CONSULTATION  Referral MD: Dr. 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  The patient came to the emergency room after noticing bright red blood in the toilet bowl. In the ER, her hemoglobin was 11.8.  Rectal exam revealed bright red blood and one small external nonthrombosed hemorrhoid.  Fecal occult blood was positive.  Case was reviewed with GI recommended colonoscopy.  She underwent a colonoscopy on 08/11/2019.  She was noted to have a rectal mass 0 to 3 cm from the anal verge which was biopsied.  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  The patient reports that her rectal bleeding has now resolved.  She did have some hematuria earlier this admission, that has now resolved as well.  Denies recent fevers or chills.  She denies anorexia and weight loss.  She denies headaches and vision changes.  Denies dizziness.  She denies chest discomfort, cough, shortness of breath.  Denies abdominal pain, nausea, vomiting, constipation, diarrhea.  Reports that she had a colonoscopy approximately 10 years ago which she thinks was normal.  The patient is divorced.  She has 1 adopted son who lives in [place].  The patient currently lives closer to the [facility], but has been staying with her sister here in [place] for the past few weeks.  She is unsure if she will discharge back to home or stay locally.  Denies history of alcohol and tobacco use. Denies family history of cancer.  Medical oncology was asked see the patient make recommendations regarding her squamous cell carcinoma of the rectum.   Past Medical History:  Diagnosis  Date   Apnea    CAD (coronary artery disease)    non-obstructive   CHF (congestive heart failure) (HCC)    CKD (chronic kidney disease), stage III    Diabetes mellitus without complication ([place])    GI bleed 07/2019   LOWER GI   Hypercholesterolemia    Hypertension    PAT (paroxysmal atrial tachycardia) ([place])    :  Past Surgical History:  Procedure Laterality Date   ABDOMINAL HYSTERECTOMY     BIOPSY  08/11/2019   Procedure: BIOPSY;  Surgeon: Jerene Bears, MD;  Location: [place];  Service: Gastroenterology;;   COLONOSCOPY  08/11/2019   COLONOSCOPY WITH PROPOFOL N/A 08/11/2019   Procedure: COLONOSCOPY WITH PROPOFOL;  Surgeon: Jerene Bears, MD;  Location: [place];  Service: Gastroenterology;  Laterality: N/A;   PACEMAKER IMPLANT     POLYPECTOMY  08/11/2019   Procedure: POLYPECTOMY;  Surgeon: Jerene Bears, MD;  Location: [facility];  Service: Gastroenterology;;   :  Current Facility-Administered Medications  Medication Dose Route Frequency Provider Last Rate Last Admin   acetaminophen (TYLENOL) tablet 650 mg  650 mg Oral Q6H PRN Shela Leff, MD       Or   acetaminophen (TYLENOL) suppository 650 mg  650 mg Rectal Q6H PRN Shela Leff, MD       atorvastatin (LIPITOR) tablet 20 mg  20 mg Oral QHS Shela Leff, MD   20 mg at 08/14/19 2138   insulin aspart (novoLOG) injection 0-15 Units  0-15 Units Subcutaneous TID WC Eulogio Bear U, DO   3 Units at 08/11/19 [phone]  insulin aspart (novoLOG) injection 0-5 Units  0-5 Units Subcutaneous QHS Vann, Jessica U, DO       metoprolol tartrate (LOPRESSOR) injection 5 mg  5 mg Intravenous Q5 min PRN Eulogio Bear U, DO   5 mg at 08/12/19 1301   metoprolol tartrate (LOPRESSOR) tablet 50 mg  50 mg Oral BID Vann, Jessica U, DO   50 mg at 08/15/19 1019   oxyCODONE (Oxy IR/ROXICODONE) immediate release tablet 5 mg  5 mg Oral Q6H PRN Oswald Hillock, MD   5 mg at 08/14/19 0933   polyethylene glycol (MIRALAX / GLYCOLAX) packet  17 g  17 g Oral Daily Oswald Hillock, MD   17 g at 08/14/19 [id number]     Allergies  Allergen Reactions   Shellfish Allergy Itching   :  History reviewed. No pertinent family history.:  Social History   Socioeconomic History   Marital status: Single    Spouse name: Not on file   Number of children: Not on file   Years of education: Not on file   Highest education level: Not on file  Occupational History   Not on file  Tobacco Use   Smoking status: Never Smoker   Smokeless tobacco: Never Used  Substance and Sexual Activity   Alcohol use: No   Drug use: No   Sexual activity: Not on file  Other Topics Concern   Not on file  Social History Narrative   Not on file   Social Determinants of Health   Financial Resource Strain:    Difficulty of Paying Living Expenses: Not on file  Food Insecurity:    Worried About [place] in the Last Year: Not on file   Ran Out of Food in the Last Year: Not on file  Transportation Needs:    Lack of Transportation (Medical): Not on file   Lack of Transportation (Non-Medical): Not on file  Physical Activity:    Days of Exercise per Week: Not on file   Minutes of Exercise per Session: Not on file  Stress:    Feeling of Stress : Not on file  Social Connections:    Frequency of Communication with Friends and Family: Not on file   Frequency of Social Gatherings with Friends and Family: Not on file   Attends Religious Services: Not on file   Active Member of Clubs or Organizations: Not on file   Attends Archivist Meetings: Not on file   Marital Status: Not on file  Intimate Partner Violence:    Fear of Current or Ex-Partner: Not on file   Emotionally Abused: Not on file   Physically Abused: Not on file   Sexually Abused: Not on file  :  Review of Systems: A comprehensive 14 point review of systems was negative except as noted in the HPI.  Exam: Patient Vitals for the past 24 hrs:  BP Temp Temp src Pulse Resp SpO2  Weight  08/15/19 1233 (!) 143/76 98 F (36.7 C) Oral 71 19 100 % --  08/15/19 1012 (!) 143/75 98.7 F (37.1 C) Oral 70 20 100 % --  08/15/19 0441 133/64 98 F (36.7 C) Oral 68 18 100 % 174 lb 2.6 oz (79 kg)  08/14/19 2000 132/65 98.2 F (36.8 C) Oral 63 18 100 % --  08/14/19 1422 121/65 98.2 F (36.8 C) Oral 65 20 -- --    General:  well-nourished in no acute distress.   Eyes:  no scleral icterus.  ENT:  There were no oropharyngeal lesions.   Lymphatics:  Negative cervical, supraclavicular or axillary adenopathy.   Respiratory: lungs were clear bilaterally without wheezing or crackles.   Cardiovascular:  Regular rate and rhythm, S1/S2, without murmur, rub or gallop.  There was no pedal edema.   GI:  abdomen was soft, flat, nontender, nondistended, without organomegaly.   Musculoskeletal: Moves all extremities x4 Skin exam was without echymosis, petichae.   Neuro exam was nonfocal. Patient was alert and oriented.  Attention was good.   Language was appropriate.  Mood was normal without depression.  Speech was not pressured.  Thought content was not tangential.     Lab Results  Component Value Date   WBC 6.6 08/15/2019   HGB 9.8 (L) 08/15/2019   HCT 29.9 (L) 08/15/2019   PLT 123 (L) 08/15/2019   GLUCOSE 86 08/15/2019   ALT 16 08/15/2019   AST 28 08/15/2019   NA 140 08/15/2019   K 5.4 (H) 08/15/2019   CL 107 08/15/2019   CREATININE 1.52 (H) 08/15/2019   BUN 32 (H) 08/15/2019   CO2 26 08/15/2019    CT CHEST W CONTRAST  Result Date: 08/11/2019 CLINICAL DATA:  Rectal mass on colonoscopy today. EXAM: CT CHEST, ABDOMEN, AND PELVIS WITH CONTRAST TECHNIQUE: Multidetector CT imaging of the chest, abdomen and pelvis was performed following the standard protocol during bolus administration of intravenous contrast. CONTRAST:  91mL OMNIPAQUE IOHEXOL 300 MG/ML  SOLN COMPARISON:  None. FINDINGS: CT CHEST FINDINGS Cardiovascular: The heart is within normal limits in size for age. There is  moderate left atrial enlargement noted. No pericardial effusion. Scattered aortic calcifications and coronary artery calcifications are noted. The pulmonary arteries appear grossly normal. Extensive left-sided chest wall collateral vessels are noted. I suspect there may be some stenosis of the left brachiocephalic vein due to the pacer wires. Mediastinum/Nodes: No mediastinal or hilar mass or adenopathy. The esophagus is grossly normal. Lungs/Pleura: Emphysematous changes and fairly significant pulmonary scarring along with areas of bronchiectasis. No superimposed infiltrates or effusions. No worrisome pulmonary lesions. No pulmonary nodules to suggest pulmonary metastatic disease. No pleural effusions. Chest wall/musculoskeletal: No breast masses, supraclavicular or axillary adenopathy. Enlarged thyroid gland with multiple thyroid nodules. None of these measures larger than 8 mm. This is likely multinodular goiter. CT ABDOMEN PELVIS FINDINGS Hepatobiliary: No focal hepatic lesions to suggest hepatic metastatic disease. No intra or extrahepatic biliary dilatation. The gallbladder appears normal. Pancreas: No mass, inflammation or ductal dilatation. Spleen: Normal in size. No focal lesions. Numerous calcified granulomas. Adrenals/Urinary Tract: The adrenal glands are unremarkable. Bilateral renal cysts are noted. 17 mm indeterminate lesion projecting off the midpole region of the left kidney posteriorly. This demonstrates high attenuation (68 Hounsfield units). It could be an enhancing solid renal lesion or a hemorrhagic cyst. Slightly more inferior and lateral to this lesion is a 15 mm lesion which measures 37 Hounsfield units and also could be a hemorrhagic cyst. 16 mm hyperdense lesion projecting off the medial cortex of the left kidney in the midpole region on image 58/3 is also indeterminate. Hyperdense lesion projecting off the midpole region of the right kidney laterally measures 73 Hounsfield units and is  indeterminate. The bladder is unremarkable. Stomach/Bowel: The stomach, duodenum and small bowel are unremarkable. No acute inflammatory changes, mass lesions or obstructive findings. Diffuse colonic diverticulosis without findings for acute diverticulitis. Ill-defined right-sided rectal wall thickening consistent with patient's known rectal cancer. No obvious perirectal involvement. No perirectal adenopathy or pelvic sidewall adenopathy the.  No enlarged nodes in the sigmoid mesocolon. Vascular/Lymphatic: Advanced atherosclerotic calcifications involving the aorta iliac arteries but no aneurysm or dissection. The branch vessels are patent. The major venous structures are patent. No mesenteric or retroperitoneal lymphadenopathy. Small medial and lateral external iliac artery lymph nodes are noted. 7 mm lymph node on image 99/3. 5 mm left-sided node on image 99/3. 5 mm right lateral external iliac node on image 98/3. 11 mm right common femoral node on image 104/3. Left lateral external iliac node on image 100/3. 13 mm right inguinal lymph node on image number 111/3. Reproductive: The uterus is surgically absent. Both ovaries are still present and appear normal. Other: No free pelvic fluid collections. Small periumbilical abdominal wall hernia containing fat. Musculoskeletal: No significant bony findings. IMPRESSION: 1. Ill-defined right-sided rectal mass correlating with the patient's known rectal cancer. No perirectal adenopathy or sigmoid mesocolon adenopathy. 2. Small/borderline iliac lymph nodes bilaterally as detailed above. There is also a 13 mm right inguinal lymph node. 3. No findings for hepatic metastatic disease and no retroperitoneal adenopathy. 4. No CT findings for pulmonary metastatic disease. 5. Four indeterminate renal lesions, most likely hyperdense cysts but will require further evaluation. Recommend MRI abdomen without and with contrast when able. 6. Colonic diverticulosis but no other colonic  lesions are identified. 7. Multinodular thyroid goiter. Electronically Signed   By: Marijo Sanes M.D.   On: 08/11/2019 14:39   CT ABDOMEN PELVIS W CONTRAST  Result Date: 08/11/2019 CLINICAL DATA:  Rectal mass on colonoscopy today. EXAM: CT CHEST, ABDOMEN, AND PELVIS WITH CONTRAST TECHNIQUE: Multidetector CT imaging of the chest, abdomen and pelvis was performed following the standard protocol during bolus administration of intravenous contrast. CONTRAST:  57mL OMNIPAQUE IOHEXOL 300 MG/ML  SOLN COMPARISON:  None. FINDINGS: CT CHEST FINDINGS Cardiovascular: The heart is within normal limits in size for age. There is moderate left atrial enlargement noted. No pericardial effusion. Scattered aortic calcifications and coronary artery calcifications are noted. The pulmonary arteries appear grossly normal. Extensive left-sided chest wall collateral vessels are noted. I suspect there may be some stenosis of the left brachiocephalic vein due to the pacer wires. Mediastinum/Nodes: No mediastinal or hilar mass or adenopathy. The esophagus is grossly normal. Lungs/Pleura: Emphysematous changes and fairly significant pulmonary scarring along with areas of bronchiectasis. No superimposed infiltrates or effusions. No worrisome pulmonary lesions. No pulmonary nodules to suggest pulmonary metastatic disease. No pleural effusions. Chest wall/musculoskeletal: No breast masses, supraclavicular or axillary adenopathy. Enlarged thyroid gland with multiple thyroid nodules. None of these measures larger than 8 mm. This is likely multinodular goiter. CT ABDOMEN PELVIS FINDINGS Hepatobiliary: No focal hepatic lesions to suggest hepatic metastatic disease. No intra or extrahepatic biliary dilatation. The gallbladder appears normal. Pancreas: No mass, inflammation or ductal dilatation. Spleen: Normal in size. No focal lesions. Numerous calcified granulomas. Adrenals/Urinary Tract: The adrenal glands are unremarkable. Bilateral renal cysts  are noted. 17 mm indeterminate lesion projecting off the midpole region of the left kidney posteriorly. This demonstrates high attenuation (68 Hounsfield units). It could be an enhancing solid renal lesion or a hemorrhagic cyst. Slightly more inferior and lateral to this lesion is a 15 mm lesion which measures 37 Hounsfield units and also could be a hemorrhagic cyst. 16 mm hyperdense lesion projecting off the medial cortex of the left kidney in the midpole region on image 58/3 is also indeterminate. Hyperdense lesion projecting off the midpole region of the right kidney laterally measures 73 Hounsfield units and is indeterminate. The bladder is unremarkable.  Stomach/Bowel: The stomach, duodenum and small bowel are unremarkable. No acute inflammatory changes, mass lesions or obstructive findings. Diffuse colonic diverticulosis without findings for acute diverticulitis. Ill-defined right-sided rectal wall thickening consistent with patient's known rectal cancer. No obvious perirectal involvement. No perirectal adenopathy or pelvic sidewall adenopathy the. No enlarged nodes in the sigmoid mesocolon. Vascular/Lymphatic: Advanced atherosclerotic calcifications involving the aorta iliac arteries but no aneurysm or dissection. The branch vessels are patent. The major venous structures are patent. No mesenteric or retroperitoneal lymphadenopathy. Small medial and lateral external iliac artery lymph nodes are noted. 7 mm lymph node on image 99/3. 5 mm left-sided node on image 99/3. 5 mm right lateral external iliac node on image 98/3. 11 mm right common femoral node on image 104/3. Left lateral external iliac node on image 100/3. 13 mm right inguinal lymph node on image number 111/3. Reproductive: The uterus is surgically absent. Both ovaries are still present and appear normal. Other: No free pelvic fluid collections. Small periumbilical abdominal wall hernia containing fat. Musculoskeletal: No significant bony findings.  IMPRESSION: 1. Ill-defined right-sided rectal mass correlating with the patient's known rectal cancer. No perirectal adenopathy or sigmoid mesocolon adenopathy. 2. Small/borderline iliac lymph nodes bilaterally as detailed above. There is also a 13 mm right inguinal lymph node. 3. No findings for hepatic metastatic disease and no retroperitoneal adenopathy. 4. No CT findings for pulmonary metastatic disease. 5. Four indeterminate renal lesions, most likely hyperdense cysts but will require further evaluation. Recommend MRI abdomen without and with contrast when able. 6. Colonic diverticulosis but no other colonic lesions are identified. 7. Multinodular thyroid goiter. Electronically Signed   By: Marijo Sanes M.D.   On: 08/11/2019 14:39     CT CHEST W CONTRAST  Result Date: 08/11/2019 CLINICAL DATA:  Rectal mass on colonoscopy today. EXAM: CT CHEST, ABDOMEN, AND PELVIS WITH CONTRAST TECHNIQUE: Multidetector CT imaging of the chest, abdomen and pelvis was performed following the standard protocol during bolus administration of intravenous contrast. CONTRAST:  66mL OMNIPAQUE IOHEXOL 300 MG/ML  SOLN COMPARISON:  None. FINDINGS: CT CHEST FINDINGS Cardiovascular: The heart is within normal limits in size for age. There is moderate left atrial enlargement noted. No pericardial effusion. Scattered aortic calcifications and coronary artery calcifications are noted. The pulmonary arteries appear grossly normal. Extensive left-sided chest wall collateral vessels are noted. I suspect there may be some stenosis of the left brachiocephalic vein due to the pacer wires. Mediastinum/Nodes: No mediastinal or hilar mass or adenopathy. The esophagus is grossly normal. Lungs/Pleura: Emphysematous changes and fairly significant pulmonary scarring along with areas of bronchiectasis. No superimposed infiltrates or effusions. No worrisome pulmonary lesions. No pulmonary nodules to suggest pulmonary metastatic disease. No pleural  effusions. Chest wall/musculoskeletal: No breast masses, supraclavicular or axillary adenopathy. Enlarged thyroid gland with multiple thyroid nodules. None of these measures larger than 8 mm. This is likely multinodular goiter. CT ABDOMEN PELVIS FINDINGS Hepatobiliary: No focal hepatic lesions to suggest hepatic metastatic disease. No intra or extrahepatic biliary dilatation. The gallbladder appears normal. Pancreas: No mass, inflammation or ductal dilatation. Spleen: Normal in size. No focal lesions. Numerous calcified granulomas. Adrenals/Urinary Tract: The adrenal glands are unremarkable. Bilateral renal cysts are noted. 17 mm indeterminate lesion projecting off the midpole region of the left kidney posteriorly. This demonstrates high attenuation (68 Hounsfield units). It could be an enhancing solid renal lesion or a hemorrhagic cyst. Slightly more inferior and lateral to this lesion is a 15 mm lesion which measures 37 Hounsfield units and also could  be a hemorrhagic cyst. 16 mm hyperdense lesion projecting off the medial cortex of the left kidney in the midpole region on image 58/3 is also indeterminate. Hyperdense lesion projecting off the midpole region of the right kidney laterally measures 73 Hounsfield units and is indeterminate. The bladder is unremarkable. Stomach/Bowel: The stomach, duodenum and small bowel are unremarkable. No acute inflammatory changes, mass lesions or obstructive findings. Diffuse colonic diverticulosis without findings for acute diverticulitis. Ill-defined right-sided rectal wall thickening consistent with patient's known rectal cancer. No obvious perirectal involvement. No perirectal adenopathy or pelvic sidewall adenopathy the. No enlarged nodes in the sigmoid mesocolon. Vascular/Lymphatic: Advanced atherosclerotic calcifications involving the aorta iliac arteries but no aneurysm or dissection. The branch vessels are patent. The major venous structures are patent. No mesenteric or  retroperitoneal lymphadenopathy. Small medial and lateral external iliac artery lymph nodes are noted. 7 mm lymph node on image 99/3. 5 mm left-sided node on image 99/3. 5 mm right lateral external iliac node on image 98/3. 11 mm right common femoral node on image 104/3. Left lateral external iliac node on image 100/3. 13 mm right inguinal lymph node on image number 111/3. Reproductive: The uterus is surgically absent. Both ovaries are still present and appear normal. Other: No free pelvic fluid collections. Small periumbilical abdominal wall hernia containing fat. Musculoskeletal: No significant bony findings. IMPRESSION: 1. Ill-defined right-sided rectal mass correlating with the patient's known rectal cancer. No perirectal adenopathy or sigmoid mesocolon adenopathy. 2. Small/borderline iliac lymph nodes bilaterally as detailed above. There is also a 13 mm right inguinal lymph node. 3. No findings for hepatic metastatic disease and no retroperitoneal adenopathy. 4. No CT findings for pulmonary metastatic disease. 5. Four indeterminate renal lesions, most likely hyperdense cysts but will require further evaluation. Recommend MRI abdomen without and with contrast when able. 6. Colonic diverticulosis but no other colonic lesions are identified. 7. Multinodular thyroid goiter. Electronically Signed   By: Marijo Sanes M.D.   On: 08/11/2019 14:39   CT ABDOMEN PELVIS W CONTRAST  Result Date: 08/11/2019 CLINICAL DATA:  Rectal mass on colonoscopy today. EXAM: CT CHEST, ABDOMEN, AND PELVIS WITH CONTRAST TECHNIQUE: Multidetector CT imaging of the chest, abdomen and pelvis was performed following the standard protocol during bolus administration of intravenous contrast. CONTRAST:  77mL OMNIPAQUE IOHEXOL 300 MG/ML  SOLN COMPARISON:  None. FINDINGS: CT CHEST FINDINGS Cardiovascular: The heart is within normal limits in size for age. There is moderate left atrial enlargement noted. No pericardial effusion. Scattered aortic  calcifications and coronary artery calcifications are noted. The pulmonary arteries appear grossly normal. Extensive left-sided chest wall collateral vessels are noted. I suspect there may be some stenosis of the left brachiocephalic vein due to the pacer wires. Mediastinum/Nodes: No mediastinal or hilar mass or adenopathy. The esophagus is grossly normal. Lungs/Pleura: Emphysematous changes and fairly significant pulmonary scarring along with areas of bronchiectasis. No superimposed infiltrates or effusions. No worrisome pulmonary lesions. No pulmonary nodules to suggest pulmonary metastatic disease. No pleural effusions. Chest wall/musculoskeletal: No breast masses, supraclavicular or axillary adenopathy. Enlarged thyroid gland with multiple thyroid nodules. None of these measures larger than 8 mm. This is likely multinodular goiter. CT ABDOMEN PELVIS FINDINGS Hepatobiliary: No focal hepatic lesions to suggest hepatic metastatic disease. No intra or extrahepatic biliary dilatation. The gallbladder appears normal. Pancreas: No mass, inflammation or ductal dilatation. Spleen: Normal in size. No focal lesions. Numerous calcified granulomas. Adrenals/Urinary Tract: The adrenal glands are unremarkable. Bilateral renal cysts are noted. 17 mm indeterminate lesion projecting  off the midpole region of the left kidney posteriorly. This demonstrates high attenuation (68 Hounsfield units). It could be an enhancing solid renal lesion or a hemorrhagic cyst. Slightly more inferior and lateral to this lesion is a 15 mm lesion which measures 37 Hounsfield units and also could be a hemorrhagic cyst. 16 mm hyperdense lesion projecting off the medial cortex of the left kidney in the midpole region on image 58/3 is also indeterminate. Hyperdense lesion projecting off the midpole region of the right kidney laterally measures 73 Hounsfield units and is indeterminate. The bladder is unremarkable. Stomach/Bowel: The stomach, duodenum and  small bowel are unremarkable. No acute inflammatory changes, mass lesions or obstructive findings. Diffuse colonic diverticulosis without findings for acute diverticulitis. Ill-defined right-sided rectal wall thickening consistent with patient's known rectal cancer. No obvious perirectal involvement. No perirectal adenopathy or pelvic sidewall adenopathy the. No enlarged nodes in the sigmoid mesocolon. Vascular/Lymphatic: Advanced atherosclerotic calcifications involving the aorta iliac arteries but no aneurysm or dissection. The branch vessels are patent. The major venous structures are patent. No mesenteric or retroperitoneal lymphadenopathy. Small medial and lateral external iliac artery lymph nodes are noted. 7 mm lymph node on image 99/3. 5 mm left-sided node on image 99/3. 5 mm right lateral external iliac node on image 98/3. 11 mm right common femoral node on image 104/3. Left lateral external iliac node on image 100/3. 13 mm right inguinal lymph node on image number 111/3. Reproductive: The uterus is surgically absent. Both ovaries are still present and appear normal. Other: No free pelvic fluid collections. Small periumbilical abdominal wall hernia containing fat. Musculoskeletal: No significant bony findings. IMPRESSION: 1. Ill-defined right-sided rectal mass correlating with the patient's known rectal cancer. No perirectal adenopathy or sigmoid mesocolon adenopathy. 2. Small/borderline iliac lymph nodes bilaterally as detailed above. There is also a 13 mm right inguinal lymph node. 3. No findings for hepatic metastatic disease and no retroperitoneal adenopathy. 4. No CT findings for pulmonary metastatic disease. 5. Four indeterminate renal lesions, most likely hyperdense cysts but will require further evaluation. Recommend MRI abdomen without and with contrast when able. 6. Colonic diverticulosis but no other colonic lesions are identified. 7. Multinodular thyroid goiter. Electronically Signed   By: Marijo Sanes M.D.   On: 08/11/2019 14:39    Pathology:  SURGICAL PATHOLOGY  CASE: [id number]  PATIENT: Candice Lynn  Surgical Pathology Report   Clinical History: GI bleed (cm)   FINAL MICROSCOPIC DIAGNOSIS:   A. COLON, ASCENDING, HEPATIC FLEXURE AND SIGMOID, POLYPECTOMY:  - Tubular adenoma(s).  - Negative for high grade dysplasia.   B. RECTUM, MASS, BIOPSY:  - Invasive squamous cell carcinoma.   COMMENT:   B.  Immunohistochemistry for p40 is positive.  CK20 and CDX-2 are  negative.  Results reported to Dr. Zenovia Jarred on 08/15/2019.  Intradepartmental consultation (Dr. Melina Copa).   Assessment and Plan:  1.  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   2.  Indeterminate renal lesions and hematuria (which is now resolved) -The patient was noted to have 4 indeterminant renal lesions which likely represent  hyperdense cysts -May need to consider an MRI of the abdomen for further evaluation (patient has a pacemaker)  3.  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  Mild thrombocytopenia -Thrombocytopenia is very mild and platelet count is overall stable -Monitor  5.  CKD stage III -Followed by nephrology closer to home -Per care everywhere, CKD is related to hypertension with  baseline creatinine 1.4-1.5 -Continue ongoing management of her hypertension and monitor renal function closely  6.  CHF -Stable, euvolemic -Lasix remains on hold  7.  Diabetes -Diet controlled -Currently on sliding scale insulin  8.  Hypertension -Hypertension currently well controlled on metoprolol  9.  Sinus tachycardia -Rate currently controlled on metoprolol   Thank you for this referral.   Mikey Bussing, DNP, AGPCNP-BC, AOCNP  I have read the above note and made changes where necessary. I have examined and interviewed the patient. I agree with the assessment and plan above.  In brief Candice Lynn is a 79 year old female with medical history significant for CKD and placement of a pacemaker.  She presented with concern of bright red blood per rectum and hematuria.  As part of her initial evaluation the patient underwent colonoscopy to evaluate for GI bleed.  She was found to have a mass in the rectum the colon with biopsy confirming squamous cell carcinoma.  CT chest abdomen pelvis has subsequently revealed inguinal lymphadenopathy and numerous nondescript lesions on the kidneys.  Due to concern for this new diagnosis of malignancy oncology was consulted for further evaluation and management.  After review of the imaging and endoscopy reports it is most likely that this represents an anal cancer with retrograde spread into the rectum.  Additional possible etiologies could include bladder cancer or cervical cancer given the patient's presence of hematuria.  Other etiologies are far less likely.  Additionally she does have inguinal lymphadenopathy as well as imaging on the kidneys concerning for spread.  As such I would recommend evaluation with a PET CT scan to determine if there are any other areas of concern.  Urological evaluation could also be considered for cystoscopy to assure that there is no bladder primary, however the patient does not have any history of smoking or other risk  factors.  #Squamous Cell Cancer --most likely etiology is anal cancer. At this time the spread appears local and she would likely be a candidate for chemoradiation therapy --no surgical intervention is required at this time, though we can proceed with an [place] discussion regarding her care --will require a PET scan in the outpatient setting.  --consider urology consult for evaluation of the hematuria to assure this is not a bladder primary. Additionally they may weigh in on the renal lesions. --recommend checking iron studies for baseline iron levels. Can start PO iron sulfate 325mg  for repletion if studies confirm deficiency.  --Oncology will continue to follow.   Ledell Peoples, MD Department of Hematology/Oncology [place] [facility] Phone: [phone] Pager: [phone] Email: [email or web address]

## 2019-08-15 NOTE — Progress Notes (Addendum)
          Daily Rounding Note  08/15/2019, 9:51 AM  LOS: 5 days   SUBJECTIVE:   Chief complaint:  Rectal mass.  Hematochezia.  Anemia.     No complaints.  No rectal bleeding.  Small brown stools.  Tolerating solid food.  Wondering when she can go home  OBJECTIVE:         Vital signs in last 24 hours:    Temp:  [98 F (36.7 C)-98.2 F (36.8 C)] 98 F (36.7 C) (01/18 0441) Pulse Rate:  [63-68] 68 (01/18 0441) Resp:  [18-20] 18 (01/18 0441) BP: (121-133)/(64-65) 133/64 (01/18 0441) SpO2:  [100 %] 100 % (01/18 0441) Weight:  [79 kg] 79 kg (01/18 0441) Last BM Date: 08/14/19 Filed Weights   08/13/19 0046 08/14/19 0450 08/15/19 0441  Weight: 79 kg 78.2 kg 79 kg   General: somewhat frail, comfortable   Heart: RRR Chest: crackles in L base, O/w clear.  No dyspnea or cought Abdomen: soft, NT, ND.  No HSM or mass.  Active BS  Extremities: no CCE Neuro/Psych:  Appropriate.  Moves all  Limbs.  No tremors.     ASSESMENT:   *   Hematochezia.  Rectal mass.  Pelvic adenopathy. Path confirms  Squamous cell cancer.   Oncology consult called.     *   Anemia, MCV normal.  Hgb 8.5 >> 10 >> 9.8.  No transfusions to date.    *   PAT.  81 ASA PTA.    *   CKD, AKI  *   Hematuria.  Indeterminate renal lesions per CTAP.     *   Hyperkalemia  *   thrombocytopenia   PLAN   *   Restart 81 ASA in 10 days if necessary.     *   GI will arrange office fup.    *   Dr Lorenso Courier of oncology will see today.  Hold off on surgical consult for now.     Candice Lynn  08/15/2019, 9:51 AM Phone [phone]    [facility] Attending    Further plans per oncology and patient  She does not need [facility] f/u unless she gets care here in [place] then might.  Signing off  Gatha Mayer, MD, [facility] 08/15/2019 5:18 PM

## 2019-08-16 DIAGNOSIS — K635 Polyp of colon: Secondary | ICD-10-CM

## 2019-08-16 DIAGNOSIS — K5790 Diverticulosis of intestine, part unspecified, without perforation or abscess without bleeding: Secondary | ICD-10-CM

## 2019-08-16 DIAGNOSIS — R3121 Asymptomatic microscopic hematuria: Secondary | ICD-10-CM

## 2019-08-16 DIAGNOSIS — E1101 Type 2 diabetes mellitus with hyperosmolarity with coma: Secondary | ICD-10-CM

## 2019-08-16 HISTORY — DX: Polyp of colon: K63.5

## 2019-08-16 HISTORY — DX: Diverticulosis of intestine, part unspecified, without perforation or abscess without bleeding: K57.90

## 2019-08-16 LAB — GLUCOSE, CAPILLARY
Glucose-Capillary: 92 mg/dL (ref 70–99)
Glucose-Capillary: 99 mg/dL (ref 70–99)
Glucose-Capillary: 86 mg/dL (ref 70–99)

## 2019-08-16 LAB — IRON AND TIBC
Iron: 29 ug/dL (ref 28–170)
Saturation Ratios: 10 % — ABNORMAL LOW (ref 10.4–31.8)
TIBC: 281 ug/dL (ref 250–450)
UIBC: 252 ug/dL

## 2019-08-16 LAB — FERRITIN: Ferritin: 16 ng/mL (ref 11–307)

## 2019-08-16 LAB — T3, FREE: T3, Free: 2.1 pg/mL (ref 2.0–4.4)

## 2019-08-16 MED ORDER — FUROSEMIDE 40 MG PO TABS: 40.0000 mg | ORAL_TABLET | Freq: Every day | ORAL | 0 refills | Status: DC

## 2019-08-16 MED ORDER — FERROUS SULFATE 325 (65 FE) MG PO TABS: 325.0000 mg | ORAL_TABLET | Freq: Two times a day (BID) | ORAL | 0 refills | Status: DC

## 2019-08-16 MED ORDER — FERROUS SULFATE 325 (65 FE) MG PO TABS: 325.0000 mg | ORAL_TABLET | Freq: Two times a day (BID) | ORAL | Status: DC

## 2019-08-16 MED ORDER — FUROSEMIDE 20 MG PO TABS: 20.0000 mg | ORAL_TABLET | Freq: Every day | ORAL | 0 refills | Status: AC

## 2019-08-16 MED ORDER — SODIUM ZIRCONIUM CYCLOSILICATE 10 G PO PACK
10.0000 g | PACK | Freq: Once | ORAL | Status: AC
  Administered 2019-08-16: 10 g via ORAL
  Filled 2019-08-16: qty 1

## 2019-08-16 MED ORDER — POLYETHYLENE GLYCOL 3350 17 G PO PACK: 17.0000 g | PACK | Freq: Every day | ORAL | 0 refills | Status: AC

## 2019-08-16 NOTE — Discharge Summary (Signed)
Physician Discharge Summary  Candice Lynn [id number] DOB: 12-25-1940 DOA: 08/09/2019  PCP: System, Pcp Not In  Admit date: 08/09/2019 Discharge date: 08/16/2019 Consultations: Gastroentetrology Dr Carlean Purl, Oncology Dr Lorenso Courier Admitted From: home Disposition: home  Discharge Diagnoses:  Principal Problem:   Lower GI bleed Active Problems:   Rectal mass   CKD (chronic kidney disease), stage III   CHF (congestive heart failure) (HCC)   Type 2 diabetes mellitus (HCC)   HTN (hypertension)   Hematochezia   Hematuria   Colon polyps   Diverticulosis  Hospital Course Summary: 79 year old female with a history of CHF, diet-controlled type 2 diabetes mellitus, hypertension, hyperlipidemia, paroxysmal atrial tachycardia, severe mitral regurgitation, CKD stage III came to ED with complaints of rectal bleeding x 1 day.Patient was visiting her sister from [place]. ED Course: Afebrile,labs showed creat 1.71 (prev 1.4), hgb 11.8 Hospital course: Patient admitted to [facility] for GI evaluation. Hgb dropped to lowest 8.5 during hospital course (I/15). Patient underwentcolonoscopy on 08/11/19 showing "Rectal mass in the distal rectum. Biopsied. Also noted to have 3 to 4 mm polyps x3  in the sigmoid colon, atthe hepatic flexure and in the ascending colon, removed with a cold snare. Diverticulosis- Blood in the entire examined colon. Rectal tumor has definitely been bleeding, but given blood in the right colon, GI could not exclude a concomitant right-sided diverticular bleeding. No active bleeding found in the right colon during this exam".  Lower GI bleed/ Rectal mass-s/p colonoscopy with findings as above, biopsy obtained. 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 CT chest /abd /pelvis did not reveal any pulmonary/hepatic mets but reported. Four indeterminate renal lesions, most likely hyperdense cysts , may  require further evaluation with MRI abdomen without and with contrast when able (patient however has a pacemaker). 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 Consulted urology per Oncology suggestion given microscopic hematuria, d/w Dr Gloriann Loan who will arrange outpatient cystoscopy within a week.  Patient currently doing well, tolerating diet with no further rectal bleeding.  Anxious to go home.  Discussed with oncology APP, okay to discharge from their standpoint -with follow-up as outpatient.  Patient plans to stay with her sister Gregary Signs with whom I have discussed in detail regarding follow-up upon discharge.  Sinus tachycardia-patient has history of syncope due to ventricular standstill, complete heart block, s/p 2 chamber pacemaker in 2017. Takes Cartia at home which for some reason was held on admission.  Started on p.o. Lopressor while here and heart rate better. 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  TSH within normal limits on 1/15, slightly elevated on 1/18, free T3-T4 within normal limits.  Follow-up endocrinology as outpatient as discussed with Ernie Avena who also reported that patient was at one point on Synthroid but no longer takes it.  Hematuria-urinary bladder is unremarkable on CT scan, has pacemaker and unable to obtain MRI.  Discussed with urology Dr. Gloriann Loan who will arrange cystoscopy to rule out primary bladder cancer or meta stasis within 1 week of discharge.   AKI on CKD stage IIIa- POA likely secondary to volume loss/GIB. Baseline Cr ~1.4. Was at 1.7 on presentation, now improved and close to baseline.  Patient takes Lasix 40 mg p.o. twice daily at home which was held on admission and hydrated.  Can resume at once daily 20mg  dosing upon discharge is eating and drinking well now with no further  bleeding.  Labs on 1/18 did show mild hyperkalemia.  Will give 1 dose of Lokelma prior  to discharge. Patient is followed by a nephrologist in [place].  Recommend repeat BMP in 5-7 days.  Chronic combined systolic and diastolic CHF-stable, euvolemic, in fact dehydrated while being here. Lasix to be resumed at once daily dosing upon discharge with extra dose prn as explained to patient/sister.  Diabetes mellitus type 2-diet controlled, managed with sliding scale insulin while here  Hypertension-resume home medications including Cartia and lower dose Lasix.  Follow-up PCP  Hyperlipidemia-continue Lipitor.  Discharge Exam:  Vitals:   08/16/19 0447 08/16/19 1251  BP: 140/74 (!) 146/65  Pulse: 76 69  Resp: 18 18  Temp: 98.6 F (37 C) 99.1 F (37.3 C)  SpO2: 97% 98%   Vitals:   08/15/19 2000 08/15/19 2137 08/16/19 0447 08/16/19 1251  BP: (!) 151/68 (!) 148/69 140/74 (!) 146/65  Pulse: 75 74 76 69  Resp: 18  18 18   Temp: 99.2 F (37.3 C)  98.6 F (37 C) 99.1 F (37.3 C)  TempSrc: Oral  Oral Oral  SpO2: 97%  97% 98%  Weight:   77.6 kg   Height:        General: Pt is alert, awake, not in acute distress Cardiovascular: RRR, S1/S2 +, no rubs, no gallops Respiratory: CTA bilaterally, no wheezing, no rhonchi Abdominal: Soft, NT, ND, bowel sounds + Extremities: no edema, no cyanosis  Discharge Condition:Stable CODE STATUS: Full code Diet recommendation: Low-salt Recommendations for Outpatient Follow-up:  1. Follow up with PCP: 5 to 7 days (requested Oncology clinic to check if unable to see PCP ) 2. Follow up with consultants: Urology Dr. Gloriann Loan in 1 week, oncology Dr. Lorenso Courier in 10 days, endocrinology in 2 weeks 3. Please obtain follow up labs including: CBC/BMP upon PCP follow-up  Home Health services upon discharge: None Equipment/Devices upon discharge: None   Discharge Instructions:  Discharge Instructions    Call MD for:  difficulty breathing, headache or visual disturbances   Complete by: As directed    Call MD for:  persistant dizziness or  light-headedness   Complete by: As directed    Call MD for:  persistant nausea and vomiting   Complete by: As directed    Call MD for:  severe uncontrolled pain   Complete by: As directed    Call MD for:  temperature >100.4   Complete by: As directed    Diet - low sodium heart healthy   Complete by: As directed    Increase activity slowly   Complete by: As directed    Increase activity slowly   Complete by: As directed      Allergies as of 08/16/2019      Reactions   Shellfish Allergy Itching      Medication List    TAKE these medications   acetaminophen 500 MG tablet Commonly known as: TYLENOL Take 500 mg by mouth every 6 (six) hours as needed for mild pain.   aspirin EC 81 MG tablet Take 81 mg by mouth daily.   atorvastatin 20 MG tablet Commonly known as: LIPITOR Take 20 mg by mouth at bedtime.   Cartia XT 180 MG 24 hr capsule Generic drug: diltiazem Take 180 mg by mouth daily.   ferrous sulfate 325 (65 FE) MG tablet Take 1 tablet (325 mg total) by mouth 2 (two) times daily with a meal.   furosemide 20 MG tablet Commonly known as: LASIX Take 1 tablet (20 mg  total) by mouth daily. Take extra dose if needed for leg swellings/shortness of breath or weight gain >5 pounds in 48 hrs What changed:   medication strength  how much to take  when to take this  additional instructions   polyethylene glycol 17 g packet Commonly known as: MIRALAX / GLYCOLAX Take 17 g by mouth daily. Start taking on: August 17, 2019       Allergies  Allergen Reactions  . Shellfish Allergy Itching      The results of significant diagnostics from this hospitalization (including imaging, microbiology, ancillary and laboratory) are listed below for reference.    Labs: BNP (last 3 results) No results for input(s): BNP in the last 8760 hours. Basic Metabolic Panel: Recent Labs  Lab 08/11/19 0150 08/12/19 0352 08/13/19 0536 08/14/19 0539 08/15/19 0429  NA 145 139 138 140  140  K 5.3* 4.1 4.0 4.1 5.4*  CL 113* 108 106 108 107  CO2 19* 22 26 23 26   GLUCOSE 106* 91 97 91 86  BUN 14 18 22  26* 32*  CREATININE 1.21* 1.22* 1.45* 1.26* 1.52*  CALCIUM 8.8* 8.4* 8.4* 8.3* 8.5*   Liver Function Tests: Recent Labs  Lab 08/15/19 0429  AST 28  ALT 16  ALKPHOS 67  BILITOT 0.5  PROT 5.9*  ALBUMIN 2.6*   No results for input(s): LIPASE, AMYLASE in the last 168 hours. No results for input(s): AMMONIA in the last 168 hours. CBC: Recent Labs  Lab 08/12/19 0352 08/12/19 1258 08/13/19 0536 08/14/19 0539 08/15/19 0429  WBC 6.6 9.3 7.1 6.6 6.6  HGB 8.5* 10.0* 9.1* 9.0* 9.8*  HCT 26.2* 30.4* 27.1* 27.8* 29.9*  MCV 91.0 89.7 89.7 89.4 90.1  PLT 130* 163 137* 121* 123*   Cardiac Enzymes: No results for input(s): CKTOTAL, CKMB, CKMBINDEX, TROPONINI in the last 168 hours. BNP: Invalid input(s): POCBNP CBG: Recent Labs  Lab 08/15/19 1101 08/15/19 1659 08/15/19 2114 08/16/19 0612 08/16/19 1106  GLUCAP 84 84 98 92 99   D-Dimer No results for input(s): DDIMER in the last 72 hours. Hgb A1c No results for input(s): HGBA1C in the last 72 hours. Lipid Profile No results for input(s): CHOL, HDL, LDLCALC, TRIG, CHOLHDL, LDLDIRECT in the last 72 hours. Thyroid function studies Recent Labs    08/15/19 1009  TSH 5.376*  T3FREE 2.1   Anemia work up Recent Labs    08/16/19 0547  FERRITIN 16  TIBC 281  IRON 29   Urinalysis    Component Value Date/Time   COLORURINE YELLOW 08/09/2019 1843   APPEARANCEUR CLEAR 08/09/2019 1843   LABSPEC 1.011 08/09/2019 1843   PHURINE 5.0 08/09/2019 1843   GLUCOSEU NEGATIVE 08/09/2019 1843   HGBUR LARGE (A) 08/09/2019 1843   BILIRUBINUR NEGATIVE 08/09/2019 1843   KETONESUR NEGATIVE 08/09/2019 1843   PROTEINUR 100 (A) 08/09/2019 1843   UROBILINOGEN 1.0 07/12/2019 1305   NITRITE NEGATIVE 08/09/2019 1843   LEUKOCYTESUR TRACE (A) 08/09/2019 1843   Sepsis Labs Invalid input(s): PROCALCITONIN,  WBC,   LACTICIDVEN Microbiology Recent Results (from the past 240 hour(s))  Urine culture     Status: Abnormal   Collection Time: 08/09/19  6:43 PM   Specimen: Urine, Random  Result Value Ref Range Status   Specimen Description URINE, RANDOM  Final   Special Requests   Final    NONE Performed at [facility], [place] [address], [place], [place] [address]    Culture 20,000 COLONIES/mL ESCHERICHIA COLI (A)  Final   Report Status  08/12/2019 FINAL  Final   Organism ID, Bacteria ESCHERICHIA COLI (A)  Final      Susceptibility   Escherichia coli - MIC*    AMPICILLIN <=2 SENSITIVE Sensitive     CEFAZOLIN <=4 SENSITIVE Sensitive     CEFTRIAXONE <=0.25 SENSITIVE Sensitive     CIPROFLOXACIN >=4 RESISTANT Resistant     GENTAMICIN <=1 SENSITIVE Sensitive     IMIPENEM <=0.25 SENSITIVE Sensitive     NITROFURANTOIN <=16 SENSITIVE Sensitive     TRIMETH/SULFA <=20 SENSITIVE Sensitive     AMPICILLIN/SULBACTAM <=2 SENSITIVE Sensitive     PIP/TAZO <=4 SENSITIVE Sensitive     * 20,000 COLONIES/mL ESCHERICHIA COLI  SARS CORONAVIRUS 2 (TAT 6-24 HRS) Nasopharyngeal Nasopharyngeal Swab     Status: None   Collection Time: 08/09/19  7:11 PM   Specimen: Nasopharyngeal Swab  Result Value Ref Range Status   SARS Coronavirus 2 NEGATIVE NEGATIVE Final    Comment: (NOTE) SARS-CoV-2 target nucleic acids are NOT DETECTED. The SARS-CoV-2 RNA is generally detectable in upper and lower respiratory specimens during the acute phase of infection. Negative results do not preclude SARS-CoV-2 infection, do not rule out co-infections with other pathogens, and should not be used as the sole basis for treatment or other patient management decisions. Negative results must be combined with clinical observations, patient history, and epidemiological information. The expected result is Negative. Fact Sheet for Patients: SugarRoll.be Fact Sheet for Healthcare  Providers: https://www.woods-mathews.com/ This test is not yet approved or cleared by the [place] FDA and  has been authorized for detection and/or diagnosis of SARS-CoV-2 by FDA under an Emergency Use Authorization (EUA). This EUA will remain  in effect (meaning this test can be used) for the duration of the COVID-19 declaration under Section 56 4(b)(1) of the Act, 21 U.S.C. section 360bbb-3(b)(1), unless the authorization is terminated or revoked sooner. Performed at [facility], [place] [address], [place], [place] [address]     Procedures/Studies: CT CHEST W CONTRAST  Result Date: 08/11/2019 CLINICAL DATA:  Rectal mass on colonoscopy today. EXAM: CT CHEST, ABDOMEN, AND PELVIS WITH CONTRAST TECHNIQUE: Multidetector CT imaging of the chest, abdomen and pelvis was performed following the standard protocol during bolus administration of intravenous contrast. CONTRAST:  65mL OMNIPAQUE IOHEXOL 300 MG/ML  SOLN COMPARISON:  None. FINDINGS: CT CHEST FINDINGS Cardiovascular: The heart is within normal limits in size for age. There is moderate left atrial enlargement noted. No pericardial effusion. Scattered aortic calcifications and coronary artery calcifications are noted. The pulmonary arteries appear grossly normal. Extensive left-sided chest wall collateral vessels are noted. I suspect there may be some stenosis of the left brachiocephalic vein due to the pacer wires. Mediastinum/Nodes: No mediastinal or hilar mass or adenopathy. The esophagus is grossly normal. Lungs/Pleura: Emphysematous changes and fairly significant pulmonary scarring along with areas of bronchiectasis. No superimposed infiltrates or effusions. No worrisome pulmonary lesions. No pulmonary nodules to suggest pulmonary metastatic disease. No pleural effusions. Chest wall/musculoskeletal: No breast masses, supraclavicular or axillary adenopathy. Enlarged thyroid gland with multiple thyroid nodules. None of these  measures larger than 8 mm. This is likely multinodular goiter. CT ABDOMEN PELVIS FINDINGS Hepatobiliary: No focal hepatic lesions to suggest hepatic metastatic disease. No intra or extrahepatic biliary dilatation. The gallbladder appears normal. Pancreas: No mass, inflammation or ductal dilatation. Spleen: Normal in size. No focal lesions. Numerous calcified granulomas. Adrenals/Urinary Tract: The adrenal glands are unremarkable. Bilateral renal cysts are noted. 17 mm indeterminate lesion projecting off the midpole region of the  left kidney posteriorly. This demonstrates high attenuation (68 Hounsfield units). It could be an enhancing solid renal lesion or a hemorrhagic cyst. Slightly more inferior and lateral to this lesion is a 15 mm lesion which measures 37 Hounsfield units and also could be a hemorrhagic cyst. 16 mm hyperdense lesion projecting off the medial cortex of the left kidney in the midpole region on image 58/3 is also indeterminate. Hyperdense lesion projecting off the midpole region of the right kidney laterally measures 73 Hounsfield units and is indeterminate. The bladder is unremarkable. Stomach/Bowel: The stomach, duodenum and small bowel are unremarkable. No acute inflammatory changes, mass lesions or obstructive findings. Diffuse colonic diverticulosis without findings for acute diverticulitis. Ill-defined right-sided rectal wall thickening consistent with patient's known rectal cancer. No obvious perirectal involvement. No perirectal adenopathy or pelvic sidewall adenopathy the. No enlarged nodes in the sigmoid mesocolon. Vascular/Lymphatic: Advanced atherosclerotic calcifications involving the aorta iliac arteries but no aneurysm or dissection. The branch vessels are patent. The major venous structures are patent. No mesenteric or retroperitoneal lymphadenopathy. Small medial and lateral external iliac artery lymph nodes are noted. 7 mm lymph node on image 99/3. 5 mm left-sided node on image  99/3. 5 mm right lateral external iliac node on image 98/3. 11 mm right common femoral node on image 104/3. Left lateral external iliac node on image 100/3. 13 mm right inguinal lymph node on image number 111/3. Reproductive: The uterus is surgically absent. Both ovaries are still present and appear normal. Other: No free pelvic fluid collections. Small periumbilical abdominal wall hernia containing fat. Musculoskeletal: No significant bony findings. IMPRESSION: 1. Ill-defined right-sided rectal mass correlating with the patient's known rectal cancer. No perirectal adenopathy or sigmoid mesocolon adenopathy. 2. Small/borderline iliac lymph nodes bilaterally as detailed above. There is also a 13 mm right inguinal lymph node. 3. No findings for hepatic metastatic disease and no retroperitoneal adenopathy. 4. No CT findings for pulmonary metastatic disease. 5. Four indeterminate renal lesions, most likely hyperdense cysts but will require further evaluation. Recommend MRI abdomen without and with contrast when able. 6. Colonic diverticulosis but no other colonic lesions are identified. 7. Multinodular thyroid goiter. Electronically Signed   By: Marijo Sanes M.D.   On: 08/11/2019 14:39   CT ABDOMEN PELVIS W CONTRAST  Result Date: 08/11/2019 CLINICAL DATA:  Rectal mass on colonoscopy today. EXAM: CT CHEST, ABDOMEN, AND PELVIS WITH CONTRAST TECHNIQUE: Multidetector CT imaging of the chest, abdomen and pelvis was performed following the standard protocol during bolus administration of intravenous contrast. CONTRAST:  63mL OMNIPAQUE IOHEXOL 300 MG/ML  SOLN COMPARISON:  None. FINDINGS: CT CHEST FINDINGS Cardiovascular: The heart is within normal limits in size for age. There is moderate left atrial enlargement noted. No pericardial effusion. Scattered aortic calcifications and coronary artery calcifications are noted. The pulmonary arteries appear grossly normal. Extensive left-sided chest wall collateral vessels are  noted. I suspect there may be some stenosis of the left brachiocephalic vein due to the pacer wires. Mediastinum/Nodes: No mediastinal or hilar mass or adenopathy. The esophagus is grossly normal. Lungs/Pleura: Emphysematous changes and fairly significant pulmonary scarring along with areas of bronchiectasis. No superimposed infiltrates or effusions. No worrisome pulmonary lesions. No pulmonary nodules to suggest pulmonary metastatic disease. No pleural effusions. Chest wall/musculoskeletal: No breast masses, supraclavicular or axillary adenopathy. Enlarged thyroid gland with multiple thyroid nodules. None of these measures larger than 8 mm. This is likely multinodular goiter. CT ABDOMEN PELVIS FINDINGS Hepatobiliary: No focal hepatic lesions to suggest hepatic metastatic disease. No  intra or extrahepatic biliary dilatation. The gallbladder appears normal. Pancreas: No mass, inflammation or ductal dilatation. Spleen: Normal in size. No focal lesions. Numerous calcified granulomas. Adrenals/Urinary Tract: The adrenal glands are unremarkable. Bilateral renal cysts are noted. 17 mm indeterminate lesion projecting off the midpole region of the left kidney posteriorly. This demonstrates high attenuation (68 Hounsfield units). It could be an enhancing solid renal lesion or a hemorrhagic cyst. Slightly more inferior and lateral to this lesion is a 15 mm lesion which measures 37 Hounsfield units and also could be a hemorrhagic cyst. 16 mm hyperdense lesion projecting off the medial cortex of the left kidney in the midpole region on image 58/3 is also indeterminate. Hyperdense lesion projecting off the midpole region of the right kidney laterally measures 73 Hounsfield units and is indeterminate. The bladder is unremarkable. Stomach/Bowel: The stomach, duodenum and small bowel are unremarkable. No acute inflammatory changes, mass lesions or obstructive findings. Diffuse colonic diverticulosis without findings for acute  diverticulitis. Ill-defined right-sided rectal wall thickening consistent with patient's known rectal cancer. No obvious perirectal involvement. No perirectal adenopathy or pelvic sidewall adenopathy the. No enlarged nodes in the sigmoid mesocolon. Vascular/Lymphatic: Advanced atherosclerotic calcifications involving the aorta iliac arteries but no aneurysm or dissection. The branch vessels are patent. The major venous structures are patent. No mesenteric or retroperitoneal lymphadenopathy. Small medial and lateral external iliac artery lymph nodes are noted. 7 mm lymph node on image 99/3. 5 mm left-sided node on image 99/3. 5 mm right lateral external iliac node on image 98/3. 11 mm right common femoral node on image 104/3. Left lateral external iliac node on image 100/3. 13 mm right inguinal lymph node on image number 111/3. Reproductive: The uterus is surgically absent. Both ovaries are still present and appear normal. Other: No free pelvic fluid collections. Small periumbilical abdominal wall hernia containing fat. Musculoskeletal: No significant bony findings. IMPRESSION: 1. Ill-defined right-sided rectal mass correlating with the patient's known rectal cancer. No perirectal adenopathy or sigmoid mesocolon adenopathy. 2. Small/borderline iliac lymph nodes bilaterally as detailed above. There is also a 13 mm right inguinal lymph node. 3. No findings for hepatic metastatic disease and no retroperitoneal adenopathy. 4. No CT findings for pulmonary metastatic disease. 5. Four indeterminate renal lesions, most likely hyperdense cysts but will require further evaluation. Recommend MRI abdomen without and with contrast when able. 6. Colonic diverticulosis but no other colonic lesions are identified. 7. Multinodular thyroid goiter. Electronically Signed   By: Marijo Sanes M.D.   On: 08/11/2019 14:39    Time coordinating discharge: Over 30 minutes  SIGNED:   Guilford Shi, MD  [facility] 08/16/2019,  3:41 PM Pager : [phone]

## 2019-08-16 NOTE — Progress Notes (Signed)
Physical Therapy Treatment Patient Details Name: Candice Lynn MRN: [id number] DOB: 12/17/1940 Today's Date: 08/16/2019    History of Present Illness Patient is a 79 y/o female who presents with rectal bleeding. Admitted with rectal mass with possible right diverticulosis in setting of GI bleed. 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 PMH includes HTN, DM, CKD, CHF, CAD and PAT.    PT Comments    Patient progressing well towards PT goals. Improved ambulation distance today with min guard assist for balance/safety. Reports she has not gotten up since last therapy session over the weekend. Encouraged pt to walk to bathroom with nursing instead of the [place]. VSS throughout session. Pt eager to return home to stay with sister in [place]. Will continue to follow.    Follow Up Recommendations  Home health PT;Supervision for mobility/OOB     Equipment Recommendations  None recommended by PT    Recommendations for Other Services       Precautions / Restrictions Precautions Precautions: Fall Restrictions Weight Bearing Restrictions: No    Mobility  Bed Mobility Overal bed mobility: Needs Assistance Bed Mobility: Supine to Sit     Supine to sit: Supervision;HOB elevated     General bed mobility comments: use of rail, no assist needed.  Transfers Overall transfer level: Needs assistance Equipment used: Rolling walker (2 wheeled)   Sit to Stand: Min guard         General transfer comment: Min guard for safety. Transferred to chair post ambulation.  Ambulation/Gait Ambulation/Gait assistance: Min guard Gait Distance (Feet): 70 Feet Assistive device: Rolling walker (2 wheeled) Gait Pattern/deviations: Step-through pattern;Decreased stride length;Trunk flexed Gait velocity: decreased   General Gait Details: Slow, steady gait with RW for support; HR stable and Sp02 >95% on RA.   Stairs             Wheelchair Mobility    Modified Rankin  (Stroke Patients Only)       Balance Overall balance assessment: Needs assistance Sitting-balance support: Feet supported;No upper extremity supported Sitting balance-Leahy Scale: Good     Standing balance support: During functional activity Standing balance-Leahy Scale: Poor Standing balance comment: Requires UE support in standing.                            Cognition Arousal/Alertness: Awake/alert Behavior During Therapy: WFL for tasks assessed/performed Overall Cognitive Status: Within Functional Limits for tasks assessed                                        Exercises      General Comments General comments (skin integrity, edema, etc.): VSS      Pertinent Vitals/Pain Pain Assessment: No/denies pain    Home Living                      Prior Function            PT Goals (current goals can now be found in the care plan section) Progress towards PT goals: Progressing toward goals    Frequency    Min 3X/week      PT Plan Current plan remains appropriate    Co-evaluation              AM-PAC PT "6 Clicks" Mobility   Outcome Measure  Help needed turning from your back to  your side while in a flat bed without using bedrails?: A Little Help needed moving from lying on your back to sitting on the side of a flat bed without using bedrails?: A Little Help needed moving to and from a bed to a chair (including a wheelchair)?: A Little Help needed standing up from a chair using your arms (e.g., wheelchair or bedside chair)?: A Little Help needed to walk in hospital room?: A Little Help needed climbing 3-5 steps with a railing? : A Little 6 Click Score: 18    End of Session Equipment Utilized During Treatment: Gait belt Activity Tolerance: Patient tolerated treatment well Patient left: in chair;with call bell/phone within reach;with chair alarm set Nurse Communication: Mobility status PT Visit Diagnosis: Muscle  weakness (generalized) (M62.81)     Time: 1414-1430 PT Time Calculation (min) (ACUTE ONLY): 16 min  Charges:  $Gait Training: 8-22 mins                     Marisa Severin, PT, DPT Acute Rehabilitation Services Pager [phone] Office [phone]       Marguarite Arbour A Sabra Heck 08/16/2019, 2:32 PM

## 2019-08-16 NOTE — Plan of Care (Signed)

## 2019-08-16 NOTE — Care Management Important Message (Signed)
Important Message  Patient Details  Name: Candice Lynn MRN: [id number] Date of Birth: August 10, 1940   Medicare Important Message Given:  Yes     Shelda Altes 08/16/2019, 12:46 PM

## 2019-08-17 ENCOUNTER — Telehealth: Payer: Self-pay | Admitting: Hematology and Oncology

## 2019-08-17 NOTE — Telephone Encounter (Signed)
A hospital f/u appt has been scheduled for Candice Lynn to see Dr. Lorenso Courier on 1/22 at 1130am. I cld and spoke to the pt's sister and provided the appt date and time. Aware to arrive 15 minutes early.

## 2019-08-19 ENCOUNTER — Inpatient Hospital Stay: Payer: Medicare Other

## 2019-08-19 ENCOUNTER — Other Ambulatory Visit: Payer: Self-pay

## 2019-08-19 ENCOUNTER — Inpatient Hospital Stay: Payer: Medicare Other | Attending: Hematology and Oncology | Admitting: Hematology and Oncology

## 2019-08-19 VITALS — BP 123/57 | HR 68 | Temp 97.8°F | Resp 17 | Ht 60.0 in | Wt 170.8 lb

## 2019-08-19 DIAGNOSIS — D500 Iron deficiency anemia secondary to blood loss (chronic): Secondary | ICD-10-CM

## 2019-08-19 DIAGNOSIS — C211 Malignant neoplasm of anal canal: Secondary | ICD-10-CM | POA: Diagnosis present

## 2019-08-19 LAB — CBC WITH DIFFERENTIAL (CANCER CENTER ONLY)
Abs Immature Granulocytes: 0.03 10*3/uL (ref 0.00–0.07)
Basophils Absolute: 0 10*3/uL (ref 0.0–0.1)
Basophils Relative: 0 %
Eosinophils Absolute: 0.1 10*3/uL (ref 0.0–0.5)
Eosinophils Relative: 2 %
HCT: 33.9 % — ABNORMAL LOW (ref 36.0–46.0)
Hemoglobin: 10.9 g/dL — ABNORMAL LOW (ref 12.0–15.0)
Immature Granulocytes: 0 %
Lymphocytes Relative: 24 %
Lymphs Abs: 1.9 10*3/uL (ref 0.7–4.0)
MCH: 29 pg (ref 26.0–34.0)
MCHC: 32.2 g/dL (ref 30.0–36.0)
MCV: 90.2 fL (ref 80.0–100.0)
Monocytes Absolute: 0.5 10*3/uL (ref 0.1–1.0)
Monocytes Relative: 7 %
Neutro Abs: 5.3 10*3/uL (ref 1.7–7.7)
Neutrophils Relative %: 67 %
Platelet Count: 201 10*3/uL (ref 150–400)
RBC: 3.76 MIL/uL — ABNORMAL LOW (ref 3.87–5.11)
RDW: 17.2 % — ABNORMAL HIGH (ref 11.5–15.5)
WBC Count: 7.9 10*3/uL (ref 4.0–10.5)
nRBC: 0.3 % — ABNORMAL HIGH (ref 0.0–0.2)

## 2019-08-19 LAB — CMP (CANCER CENTER ONLY)
ALT: 20 U/L (ref 0–44)
AST: 31 U/L (ref 15–41)
Albumin: 3.5 g/dL (ref 3.5–5.0)
Alkaline Phosphatase: 96 U/L (ref 38–126)
Anion gap: 11 (ref 5–15)
BUN: 26 mg/dL — ABNORMAL HIGH (ref 8–23)
CO2: 25 mmol/L (ref 22–32)
Calcium: 8.8 mg/dL — ABNORMAL LOW (ref 8.9–10.3)
Chloride: 108 mmol/L (ref 98–111)
Creatinine: 1.39 mg/dL — ABNORMAL HIGH (ref 0.44–1.00)
GFR, Est AFR Am: 42 mL/min — ABNORMAL LOW (ref >60)
GFR, Estimated: 36 mL/min — ABNORMAL LOW (ref >60)
Glucose, Bld: 99 mg/dL (ref 70–99)
Potassium: 3.7 mmol/L (ref 3.5–5.1)
Sodium: 144 mmol/L (ref 135–145)
Total Bilirubin: 0.4 mg/dL (ref 0.3–1.2)
Total Protein: 7.9 g/dL (ref 6.5–8.1)

## 2019-08-19 NOTE — Progress Notes (Signed)
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 Hgb 11.8 2)  08/10/2019: Hgb dropped to 9.9.  3)  08/11/2019: patient underwent colonoscopy showing rectal mass in the distal rectum. Biopsy confirmed invasive squamous cel cancer. Blood throughout the entire colon concerning for concomitant right sided diverticular bleeding 4)  08/11/2019: CT C/A/P revealed right-sided rectal mass correlating with the patient's known rectal cancer. Also had several small medial and lateral external iliac artery lymph nodes, including a 13 mm right inguinal lymph node. 5) 08/15/2019: establish care with Dr. Lorenso Courier while inpatient.   Interval History:  Candice Lynn 79 y.o. female with medical history significant for newly diagnosed anal squamous cell cancer who presents for a follow up visit. The patient was last seen in the inpatient setting on 08/15/2019 . In the interim since the last visit Candice Lynn was d/c from the hospital with stable Hgb and PT recommendations for home health PT.   On exam today Candice Lynn is accompanied by her sister who [place].  [place] who [place] in [place].  On discussion today Candice Lynn was not aware of the reason for today's consult, and we reiterated the fact that she had a localized anal squamous cell cancer.  It is not entirely clear how much the patient is able to remember and comprehend, though she answers all questions appropriately.    She reports that since she left the hospital she has been eating well with no nausea, vomiting, or diarrhea.  She notes that she is having very little bleeding, mostly blood on the toilet paper after wiping when she has a bowel movement.  And asked  about blood in the urine the patient notes that initially she was confused as to where the source of the bleeding was as she did have blood running down her leg at one point in time.  The patient and her sister were unsure if it was urinary or coming from the digestive tract.  On further discussion it is apparent that there is no blood in the urine, no clots being passed, and no other source of bleeding other than from the rectum.  She is not having any pain or straining with bowel movements.  In terms of her physical activity she is not highly mobile.  She reports that she is able to walk to the bathroom into the kitchen, but is not able to leave the house.  She can only walk for very short stretches and spends most of her time in bed.  Full 10 point ROS is listed below.  With further discussion with her sister it was noted that their mother had rectal cancer in her late 28s and chose palliative treatment moving forward.  As such they are more willing and open to consideration of palliative care alone for her cancer.  I expressed to the patient and her sister my concern given her history of CAD, congestive heart failure, diabetes, and CKD, and advanced age that she is not an ideal candidate for chemoradiation with curative intent.  The patient and her sister voiced her understanding and noted that they would put some consideration into how they would want approach this moving forward.  MEDICAL HISTORY:  Past  Medical History:  Diagnosis Date   Apnea    CAD (coronary artery disease)    non-obstructive   CHF (congestive heart failure) (HCC)    CKD (chronic kidney disease), stage III    Diabetes mellitus without complication ([place])    GI bleed 07/2019   LOWER GI   Hypercholesterolemia    Hypertension    PAT (paroxysmal atrial tachycardia) ([facility])     SURGICAL HISTORY: Past Surgical History:  Procedure Laterality Date   ABDOMINAL HYSTERECTOMY     BIOPSY  08/11/2019   Procedure: BIOPSY;   Surgeon: Jerene Bears, MD;  Location: [facility];  Service: Gastroenterology;;   COLONOSCOPY  08/11/2019   COLONOSCOPY WITH PROPOFOL N/A 08/11/2019   Procedure: COLONOSCOPY WITH PROPOFOL;  Surgeon: Jerene Bears, MD;  Location: [facility];  Service: Gastroenterology;  Laterality: N/A;   PACEMAKER IMPLANT     POLYPECTOMY  08/11/2019   Procedure: POLYPECTOMY;  Surgeon: Jerene Bears, MD;  Location: [facility] ENDOSCOPY;  Service: Gastroenterology;;    SOCIAL HISTORY: Social History   Socioeconomic History   Marital status: Single    Spouse name: Not on file   Number of children: Not on file   Years of education: Not on file   Highest education level: Not on file  Occupational History   Not on file  Tobacco Use   Smoking status: Never Smoker   Smokeless tobacco: Never Used  Substance and Sexual Activity   Alcohol use: No   Drug use: No   Sexual activity: Not on file  Other Topics Concern   Not on file  Social History Narrative   Not on file   Social Determinants of Health   Financial Resource Strain:    Difficulty of Paying Living Expenses: Not on file  Food Insecurity:    Worried About [place] in the Last Year: Not on file   Ran Out of Food in the Last Year: Not on file  Transportation Needs:    Lack of Transportation (Medical): Not on file   Lack of Transportation (Non-Medical): Not on file  Physical Activity:    Days of Exercise per Week: Not on file   Minutes of Exercise per Session: Not on file  Stress:    Feeling of Stress : Not on file  Social Connections:    Frequency of Communication with Friends and Family: Not on file   Frequency of Social Gatherings with Friends and Family: Not on file   Attends Religious Services: Not on file   Active Member of Clubs or Organizations: Not on file   Attends Archivist Meetings: Not on file   Marital Status: Not on file  Intimate Partner Violence:    Fear of Current or  Ex-Partner: Not on file   Emotionally Abused: Not on file   Physically Abused: Not on file   Sexually Abused: Not on file    FAMILY HISTORY: No family history on file.  ALLERGIES:  is allergic to shellfish allergy.  MEDICATIONS:  Current Outpatient Medications  Medication Sig Dispense Refill   acetaminophen (TYLENOL) 500 MG tablet Take 500 mg by mouth every 6 (six) hours as needed for mild pain.     allopurinol (ZYLOPRIM) 300 MG tablet Take 300 mg by mouth daily.     aspirin EC 81 MG tablet Take 81 mg by mouth daily.     atorvastatin (LIPITOR) 20 MG tablet Take 20 mg by mouth at bedtime.     CARTIA XT 180  MG 24 hr capsule Take 180 mg by mouth daily.     metoprolol tartrate (LOPRESSOR) 50 MG tablet Take 50 mg by mouth 2 (two) times daily.     ferrous sulfate 325 (65 FE) MG tablet Take 1 tablet (325 mg total) by mouth 2 (two) times daily with a meal. 60 tablet 0   furosemide (LASIX) 20 MG tablet Take 1 tablet (20 mg total) by mouth daily. Take extra dose if needed for leg swellings/shortness of breath or weight gain >5 pounds in 48 hrs 30 tablet 0   polyethylene glycol (MIRALAX / GLYCOLAX) 17 g packet Take 17 g by mouth daily. 14 each 0   No current facility-administered medications for this visit.    REVIEW OF SYSTEMS:   Constitutional: ( - ) fevers, ( - )  chills , ( - ) night sweats Eyes: ( - ) blurriness of vision, ( - ) double vision, ( - ) watery eyes Ears, nose, mouth, throat, and face: ( - ) mucositis, ( - ) sore throat Respiratory: ( - ) cough, ( - ) dyspnea, ( - ) wheezes Cardiovascular: ( - ) palpitation, ( - ) chest discomfort, ( - ) lower extremity swelling Gastrointestinal:  ( - ) nausea, ( - ) heartburn, ( - ) change in bowel habits Skin: ( - ) abnormal skin rashes Lymphatics: ( - ) new lymphadenopathy, ( - ) easy bruising Neurological: ( - ) numbness, ( - ) tingling, ( - ) new weaknesses Behavioral/Psych: ( - ) mood change, ( - ) new changes  All other  systems were reviewed with the patient and are negative.  PHYSICAL EXAMINATION: ECOG PERFORMANCE STATUS: 3 - Symptomatic, >50% confined to bed  Vitals:   08/19/19 1146  BP: (!) 123/57  Pulse: 68  Resp: 17  Temp: 97.8 F (36.6 C)  SpO2: 100%   Filed Weights   08/19/19 1146  Weight: 170 lb 12.8 oz (77.5 kg)    GENERAL: well appearing elderly African American female in a wheelchair, in no distress and comfortable SKIN: skin color, texture, turgor are normal, no rashes or significant lesions EYES: conjunctiva are pink and non-injected, sclera clear LUNGS: clear to auscultation and percussion with normal breathing effort HEART: regular rate & rhythm and no murmurs and no lower extremity edema Musculoskeletal: no cyanosis of digits and no clubbing  PSYCH: alert & oriented x 3, fluent speech NEURO: no focal motor/sensory deficits  LABORATORY DATA:  I have reviewed the data as listed CBC Latest Ref Rng & Units 08/19/2019 08/15/2019 08/14/2019  WBC 4.0 - 10.5 K/uL 7.9 6.6 6.6  Hemoglobin 12.0 - 15.0 g/dL 10.9(L) 9.8(L) 9.0(L)  Hematocrit 36.0 - 46.0 % 33.9(L) 29.9(L) 27.8(L)  Platelets 150 - 400 K/uL 201 123(L) 121(L)    CMP Latest Ref Rng & Units 08/15/2019 08/14/2019 08/13/2019  Glucose 70 - 99 mg/dL 86 91 97  BUN 8 - 23 mg/dL 32(H) 26(H) 22  Creatinine 0.44 - 1.00 mg/dL 1.52(H) 1.26(H) 1.45(H)  Sodium 135 - 145 mmol/L 140 140 138  Potassium 3.5 - 5.1 mmol/L 5.4(H) 4.1 4.0  Chloride 98 - 111 mmol/L 107 108 106  CO2 22 - 32 mmol/L 26 23 26   Calcium 8.9 - 10.3 mg/dL 8.5(L) 8.3(L) 8.4(L)  Total Protein 6.5 - 8.1 g/dL 5.9(L) - -  Total Bilirubin 0.3 - 1.2 mg/dL 0.5 - -  Alkaline Phos 38 - 126 U/L 67 - -  AST 15 - 41 U/L 28 - -  ALT 0 -  44 U/L 16 - -    RADIOGRAPHIC STUDIES: I have personally reviewed the radiological images as listed and agreed with the findings in the report: rectal mass with mildly enlarged local lymph nodes.  CT CHEST W CONTRAST  Result Date:  08/11/2019 CLINICAL DATA:  Rectal mass on colonoscopy today. EXAM: CT CHEST, ABDOMEN, AND PELVIS WITH CONTRAST TECHNIQUE: Multidetector CT imaging of the chest, abdomen and pelvis was performed following the standard protocol during bolus administration of intravenous contrast. CONTRAST:  94mL OMNIPAQUE IOHEXOL 300 MG/ML  SOLN COMPARISON:  None. FINDINGS: CT CHEST FINDINGS Cardiovascular: The heart is within normal limits in size for age. There is moderate left atrial enlargement noted. No pericardial effusion. Scattered aortic calcifications and coronary artery calcifications are noted. The pulmonary arteries appear grossly normal. Extensive left-sided chest wall collateral vessels are noted. I suspect there may be some stenosis of the left brachiocephalic vein due to the pacer wires. Mediastinum/Nodes: No mediastinal or hilar mass or adenopathy. The esophagus is grossly normal. Lungs/Pleura: Emphysematous changes and fairly significant pulmonary scarring along with areas of bronchiectasis. No superimposed infiltrates or effusions. No worrisome pulmonary lesions. No pulmonary nodules to suggest pulmonary metastatic disease. No pleural effusions. Chest wall/musculoskeletal: No breast masses, supraclavicular or axillary adenopathy. Enlarged thyroid gland with multiple thyroid nodules. None of these measures larger than 8 mm. This is likely multinodular goiter. CT ABDOMEN PELVIS FINDINGS Hepatobiliary: No focal hepatic lesions to suggest hepatic metastatic disease. No intra or extrahepatic biliary dilatation. The gallbladder appears normal. Pancreas: No mass, inflammation or ductal dilatation. Spleen: Normal in size. No focal lesions. Numerous calcified granulomas. Adrenals/Urinary Tract: The adrenal glands are unremarkable. Bilateral renal cysts are noted. 17 mm indeterminate lesion projecting off the midpole region of the left kidney posteriorly. This demonstrates high attenuation (68 Hounsfield units). It could be  an enhancing solid renal lesion or a hemorrhagic cyst. Slightly more inferior and lateral to this lesion is a 15 mm lesion which measures 37 Hounsfield units and also could be a hemorrhagic cyst. 16 mm hyperdense lesion projecting off the medial cortex of the left kidney in the midpole region on image 58/3 is also indeterminate. Hyperdense lesion projecting off the midpole region of the right kidney laterally measures 73 Hounsfield units and is indeterminate. The bladder is unremarkable. Stomach/Bowel: The stomach, duodenum and small bowel are unremarkable. No acute inflammatory changes, mass lesions or obstructive findings. Diffuse colonic diverticulosis without findings for acute diverticulitis. Ill-defined right-sided rectal wall thickening consistent with patient's known rectal cancer. No obvious perirectal involvement. No perirectal adenopathy or pelvic sidewall adenopathy the. No enlarged nodes in the sigmoid mesocolon. Vascular/Lymphatic: Advanced atherosclerotic calcifications involving the aorta iliac arteries but no aneurysm or dissection. The branch vessels are patent. The major venous structures are patent. No mesenteric or retroperitoneal lymphadenopathy. Small medial and lateral external iliac artery lymph nodes are noted. 7 mm lymph node on image 99/3. 5 mm left-sided node on image 99/3. 5 mm right lateral external iliac node on image 98/3. 11 mm right common femoral node on image 104/3. Left lateral external iliac node on image 100/3. 13 mm right inguinal lymph node on image number 111/3. Reproductive: The uterus is surgically absent. Both ovaries are still present and appear normal. Other: No free pelvic fluid collections. Small periumbilical abdominal wall hernia containing fat. Musculoskeletal: No significant bony findings. IMPRESSION: 1. Ill-defined right-sided rectal mass correlating with the patient's known rectal cancer. No perirectal adenopathy or sigmoid mesocolon adenopathy. 2.  Small/borderline iliac lymph nodes bilaterally  as detailed above. There is also a 13 mm right inguinal lymph node. 3. No findings for hepatic metastatic disease and no retroperitoneal adenopathy. 4. No CT findings for pulmonary metastatic disease. 5. Four indeterminate renal lesions, most likely hyperdense cysts but will require further evaluation. Recommend MRI abdomen without and with contrast when able. 6. Colonic diverticulosis but no other colonic lesions are identified. 7. Multinodular thyroid goiter. Electronically Signed   By: Marijo Sanes M.D.   On: 08/11/2019 14:39   CT ABDOMEN PELVIS W CONTRAST  Result Date: 08/11/2019 CLINICAL DATA:  Rectal mass on colonoscopy today. EXAM: CT CHEST, ABDOMEN, AND PELVIS WITH CONTRAST TECHNIQUE: Multidetector CT imaging of the chest, abdomen and pelvis was performed following the standard protocol during bolus administration of intravenous contrast. CONTRAST:  14mL OMNIPAQUE IOHEXOL 300 MG/ML  SOLN COMPARISON:  None. FINDINGS: CT CHEST FINDINGS Cardiovascular: The heart is within normal limits in size for age. There is moderate left atrial enlargement noted. No pericardial effusion. Scattered aortic calcifications and coronary artery calcifications are noted. The pulmonary arteries appear grossly normal. Extensive left-sided chest wall collateral vessels are noted. I suspect there may be some stenosis of the left brachiocephalic vein due to the pacer wires. Mediastinum/Nodes: No mediastinal or hilar mass or adenopathy. The esophagus is grossly normal. Lungs/Pleura: Emphysematous changes and fairly significant pulmonary scarring along with areas of bronchiectasis. No superimposed infiltrates or effusions. No worrisome pulmonary lesions. No pulmonary nodules to suggest pulmonary metastatic disease. No pleural effusions. Chest wall/musculoskeletal: No breast masses, supraclavicular or axillary adenopathy. Enlarged thyroid gland with multiple thyroid nodules. None of  these measures larger than 8 mm. This is likely multinodular goiter. CT ABDOMEN PELVIS FINDINGS Hepatobiliary: No focal hepatic lesions to suggest hepatic metastatic disease. No intra or extrahepatic biliary dilatation. The gallbladder appears normal. Pancreas: No mass, inflammation or ductal dilatation. Spleen: Normal in size. No focal lesions. Numerous calcified granulomas. Adrenals/Urinary Tract: The adrenal glands are unremarkable. Bilateral renal cysts are noted. 17 mm indeterminate lesion projecting off the midpole region of the left kidney posteriorly. This demonstrates high attenuation (68 Hounsfield units). It could be an enhancing solid renal lesion or a hemorrhagic cyst. Slightly more inferior and lateral to this lesion is a 15 mm lesion which measures 37 Hounsfield units and also could be a hemorrhagic cyst. 16 mm hyperdense lesion projecting off the medial cortex of the left kidney in the midpole region on image 58/3 is also indeterminate. Hyperdense lesion projecting off the midpole region of the right kidney laterally measures 73 Hounsfield units and is indeterminate. The bladder is unremarkable. Stomach/Bowel: The stomach, duodenum and small bowel are unremarkable. No acute inflammatory changes, mass lesions or obstructive findings. Diffuse colonic diverticulosis without findings for acute diverticulitis. Ill-defined right-sided rectal wall thickening consistent with patient's known rectal cancer. No obvious perirectal involvement. No perirectal adenopathy or pelvic sidewall adenopathy the. No enlarged nodes in the sigmoid mesocolon. Vascular/Lymphatic: Advanced atherosclerotic calcifications involving the aorta iliac arteries but no aneurysm or dissection. The branch vessels are patent. The major venous structures are patent. No mesenteric or retroperitoneal lymphadenopathy. Small medial and lateral external iliac artery lymph nodes are noted. 7 mm lymph node on image 99/3. 5 mm left-sided node on  image 99/3. 5 mm right lateral external iliac node on image 98/3. 11 mm right common femoral node on image 104/3. Left lateral external iliac node on image 100/3. 13 mm right inguinal lymph node on image number 111/3. Reproductive: The uterus is surgically absent. Both ovaries are  still present and appear normal. Other: No free pelvic fluid collections. Small periumbilical abdominal wall hernia containing fat. Musculoskeletal: No significant bony findings. IMPRESSION: 1. Ill-defined right-sided rectal mass correlating with the patient's known rectal cancer. No perirectal adenopathy or sigmoid mesocolon adenopathy. 2. Small/borderline iliac lymph nodes bilaterally as detailed above. There is also a 13 mm right inguinal lymph node. 3. No findings for hepatic metastatic disease and no retroperitoneal adenopathy. 4. No CT findings for pulmonary metastatic disease. 5. Four indeterminate renal lesions, most likely hyperdense cysts but will require further evaluation. Recommend MRI abdomen without and with contrast when able. 6. Colonic diverticulosis but no other colonic lesions are identified. 7. Multinodular thyroid goiter. Electronically Signed   By: Marijo Sanes M.D.   On: 08/11/2019 14:39    ASSESSMENT & PLAN Candice Lynn 79 y.o. female with medical history significant for newly diagnosed anal squamous cell cancer who presents for a follow up visit.  After review of the history discussion with the patient I am concerned that she is not a good candidate for chemoradiation therapy.  This is due to her poor functional status with her mostly staying in bed and only getting up to eat and use the restroom.  Additionally I am concerned about her CAD, pacemaker, congestive heart failure, and CKD.  With all of these conditions I do not believe that she would be able to tolerate full-strength chemoradiation.  As such I let her know that without this treatment her cancer would be terminal.  She and her sister voiced  her understanding of this.  Furthermore we discussed the possibility that we could consider palliative treatment including localized radiation as well as possible immunotherapy.  I noted that these treatments would be noncurative in nature and would be intended to slow down the growth of the tumor.  The patient and her sister have had a similar experience with her mother who had rectal cancer in her late 46s.  At that time they decided not to go forward with any treatment other than comfort based measures.  As such they are familiar with palliative only measures.  At the end of our visit we decided that Candice Lynn to talk with her son who lives in [place] and the rest of her family to come up with a decision moving forward.  If she would like to pursue a form of palliative immunotherapy that could certainly be considered.  Additionally I think it be worthwhile for her to speak with radiation oncology to see if there was anything they could offer in the form of palliative radiation.  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  I will continue to emphasize the terminal nature of her disease and stress the risks and benefits of her decisions moving forward.  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 I think a palliative approach would be most appropriate. The patient and her sister are agreeable to this plan. --recommend consult to radiation oncology to explore any possible palliative options for her  --discussed possible palliative treatment including immunotherapy vs radiation. Patient and her sister are currently weighing these options.  --patient is currently asymptomatic other than light rectal bleeding and not having any issues with pain or weight loss. Will continue to monitor  --f/u  in 3 months time.  #  Rectal Bleeding --secondary to rectal mass, reportedly improved from hospitalization --continue to take PO iron 325mg  daily  --repeat CBC today and on f/u visits.  Orders Placed This Encounter  Procedures   CBC with Differential ([facility] Only)    Standing Status:   Future    Number of Occurrences:   1    Standing Expiration Date:   08/18/2020   CMP ([place] [facility] only)    Standing Status:   Future    Number of Occurrences:   1    Standing Expiration Date:   08/18/2020    All questions were answered. The patient knows to call the clinic with any problems, questions or concerns.  A total of more than 40 minutes were spent on this encounter and over half of that time was spent on counseling and coordination of care as outlined above.   Ledell Peoples, MD Department of Hematology/Oncology [place] [facility] Phone: [phone] Pager: [phone] Email: [email or web address]  08/19/2019 1:28 PM

## 2019-08-20 ENCOUNTER — Other Ambulatory Visit: Payer: Self-pay | Admitting: Hematology and Oncology

## 2019-08-20 ENCOUNTER — Encounter: Payer: Self-pay | Admitting: Hematology and Oncology

## 2019-08-20 DIAGNOSIS — C210 Malignant neoplasm of anus, unspecified: Secondary | ICD-10-CM

## 2019-08-22 ENCOUNTER — Telehealth: Payer: Self-pay | Admitting: Hematology and Oncology

## 2019-08-22 ENCOUNTER — Telehealth: Payer: Self-pay | Admitting: *Deleted

## 2019-08-22 NOTE — Telephone Encounter (Signed)
Called patient to schedule telephone encounter with Dr. Lisbeth Renshaw. Patient confirmed.

## 2019-08-22 NOTE — Telephone Encounter (Signed)
Scheduled per los. Called and left msg. Mailed printout  °

## 2019-08-22 NOTE — Progress Notes (Signed)
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  Hgb dropped to 9.9 on 08/10/2019.  Colonoscopy 08/11/2019: rectal mass in the distal rectum.  CT CAP 08/11/2019: right sided rectal mass correlating with the patient's known rectal cancer.  Also had several small medial and lateral external iliac artery lymph nodes, including a 13 mm right inguinal lymph node.  Biopsies of Rectum, Ascending Colon 08/11/2019   Past/Anticipated interventions by surgeon, if any:   Past/Anticipated interventions by medical oncology, if any:  Dr. Lorenso Courier 08/19/2019 -Given her history of CAD, congestive heart failure, diabetes and CKD, and advanced age that she is not an ideal candidate for chemoradiation with curative intent. -The patient and her sister voiced her understanding and noted that they would put some consideration into how they would want to approach this moving forward. -We discussed the possibility that we could consider palliative treatment including localized radiation as well as possible immunotherapy. -At the end of our visit we decided that [name] will talk with her son and family to come up with a decision moving forward. -If she would like to pursue a form of palliative immunotherapy that could certainly be considered. -Additionally I think it be worthwhile for her to speak with radiation oncology to see if there was anything they could offer in the form of palliative radiation. -I am somewhat concerned about [name]'s level of comprehension as she appears to have a mild cognitive decline.  Weight changes, if any: Stable  Bowel/Bladder complaints, if any: Regular bowels, has urinary incontinence and frequency.  Nausea / Vomiting, if any: No  Pain issues, if any:  No  Any blood per rectum: States she has blood smears on the toilet paper.    SAFETY ISSUES:  Prior radiation?  No  Pacemaker/ICD? Yes  Possible current pregnancy? Hysterectomy  Is the patient on methotrexate? No  Current Complaints/Details: -Mom had rectal cancer in her 80's-chose palliative care alone.

## 2019-08-23 ENCOUNTER — Ambulatory Visit
Admission: RE | Admit: 2019-08-23 | Discharge: 2019-08-23 | Disposition: A | Discharge status: Home / Self Care | Payer: Medicare Other | Source: Ambulatory Visit | Attending: Radiation Oncology | Admitting: Radiation Oncology

## 2019-08-23 ENCOUNTER — Encounter: Payer: Self-pay | Admitting: Radiation Oncology

## 2019-08-23 ENCOUNTER — Other Ambulatory Visit: Payer: Self-pay

## 2019-08-23 DIAGNOSIS — C210 Malignant neoplasm of anus, unspecified: Secondary | ICD-10-CM

## 2019-08-23 HISTORY — DX: Malignant neoplasm of anus, unspecified: C21.0

## 2019-08-23 NOTE — Progress Notes (Signed)
Radiation Oncology         [phone] ________________________________  Initial Outpatient Consultation - Conducted via telephone due to current COVID-19 concerns for limiting patient exposure  I spoke with the patient to conduct this consult visit via telephone to spare the patient unnecessary potential exposure in the healthcare setting during the current COVID-19 pandemic. The patient was notified in advance and was offered a [place] meeting to allow for face to face communication but unfortunately reported that they did not have the appropriate resources/technology to support such a visit and instead preferred to proceed with a telephone consult.  ________________________________  Name: Candice Lynn        MRN: [id number]  Date of Service: 08/23/2019 DOB: 09-15-1940  [id number], Pcp Not In  Orson Slick, MD     REFERRING PHYSICIAN: Orson Slick, MD   DIAGNOSIS: The encounter diagnosis was Squamous cell carcinoma of anus ([place]).   HISTORY OF PRESENT ILLNESS: Candice Lynn is a 79 y.o. female seen at the request of Dr. Lorenso Courier for a new diagnosis of anal cancer in the distal anus involving the rectum.  The patient lives at the [place], she has been visiting her sister who lives in the area and started noticing bright red blood per rectum for approximately a week.  She was seen in the emergency room on 08/09/19 with rectal bleeding. She was found to have a positive Hemoccult and was admitted.  She underwent colonoscopy on 08/11/2019 which revealed a firm 3 cm rectal mass at the anal verge, visibly appearing to be fungating with ulcerative changes and visible vessels it was nonobstructing in the distal rectum and measured approximately 3 cm in length with diameter was approximately 3 mm, extending to the dentate line.  There were several polyps in the sigmoid hepatic and ascending colon multiple diverticular were noted in the sigmoid colon descending colon ascending colon and cecum.   Final pathology from her biopsies revealed tubular adenomatous changes of the ascending hepatic flexure and sigmoid colon negative for dysplastic findings, and an invasive squamous cell carcinoma in the rectal mass.  CT imaging was performed revealing an ill-defined right-sided rectal mass correlating with her known rectal cancer without perirectal adenopathy or sigmoid adenopathy there were some borderline lymph nodes in the iliac region bilaterally as well as a node measuring 13 mm in the right inguinal chain.  For indeterminate renal lesions were also noted most likely consistent with cysts.  Her CT of the chest did not reveal any metastatic disease nor was there evidence of disease elsewhere.  She was seen by oncology who felt that she was not a good candidate for any systemic therapy given her other comorbidities and performance status.  She was a possible candidate for palliative immunotherapy plus or minus palliative radiotherapy and is contacted today to discuss these options.     PREVIOUS RADIATION THERAPY: No   PAST MEDICAL HISTORY:  Past Medical History:  Diagnosis Date   Apnea    CAD (coronary artery disease)    non-obstructive   CHF (congestive heart failure) (HCC)    CKD (chronic kidney disease), stage III    Diabetes mellitus without complication ([place])    GI bleed 07/2019   LOWER GI   Hypercholesterolemia    Hypertension    PAT (paroxysmal atrial tachycardia) ([place])        PAST SURGICAL HISTORY: Past Surgical History:  Procedure Laterality Date   ABDOMINAL HYSTERECTOMY     BIOPSY  08/11/2019   Procedure: BIOPSY;  Surgeon: Jerene Bears, MD;  Location: [facility];  Service: Gastroenterology;;   COLONOSCOPY  08/11/2019   COLONOSCOPY WITH PROPOFOL N/A 08/11/2019   Procedure: COLONOSCOPY WITH PROPOFOL;  Surgeon: Jerene Bears, MD;  Location: [facility];  Service: Gastroenterology;  Laterality: N/A;   PACEMAKER IMPLANT     POLYPECTOMY  08/11/2019    Procedure: POLYPECTOMY;  Surgeon: Jerene Bears, MD;  Location[facility] END[facility]COPY;  Service: Gastroenterology;;     FAMILY HISTORY: History reviewed. No pertinent family history.   SOCIAL HISTORY:  reports that she has never smoked. She has never used smokeless tobacco. She reports that she does not drink alcohol or use drugs.  The patient is single.  She does not have any children.  She has a sister who lives in [place] who she is currently residing with.  She hopes to get back to her home in [place] which is most close to [place] and [place].   ALLERGIES: Shellfish allergy   MEDICATIONS:  Current Outpatient Medications  Medication Sig Dispense Refill   acetaminophen (TYLENOL) 500 MG tablet Take 500 mg by mouth every 6 (six) hours as needed for mild pain.     allopurinol (ZYLOPRIM) 300 MG tablet Take 300 mg by mouth daily.     aspirin EC 81 MG tablet Take 81 mg by mouth daily.     atorvastatin (LIPITOR) 20 MG tablet Take 20 mg by mouth at bedtime.     CARTIA XT 180 MG 24 hr capsule Take 180 mg by mouth daily.     ferrous sulfate 325 (65 FE) MG tablet Take 1 tablet (325 mg total) by mouth 2 (two) times daily with a meal. 60 tablet 0   furosemide (LASIX) 20 MG tablet Take 1 tablet (20 mg total) by mouth daily. Take extra dose if needed for leg swellings/shortness of breath or weight gain >5 pounds in 48 hrs 30 tablet 0   metoprolol tartrate (LOPRESSOR) 50 MG tablet Take 50 mg by mouth 2 (two) times daily.     polyethylene glycol (MIRALAX / GLYCOLAX) 17 g packet Take 17 g by mouth daily. 14 each 0   Ear-Loop Mask Small MISC Place into the nose.     No current facility-administered medications for this encounter.     REVIEW OF SYSTEMS: On review of systems, the patient reports that she is doing well overall. She denies any chest pain, shortness of breath, cough, fevers, chills, night sweats, unintended weight changes. She does have trouble with her heart and  with energy that causes her to be unable to do much at her sister's house due to going up and down stairs. She is however able to do her laundry and cook for herself at home. She denies any bowel  disturbances, and denies abdominal pain, nausea or vomiting. She is having a little bit of rectal bleeding but not severe per report.  She has been incontinent for years. She denies any history of abnormal pap smears and had a hysterectomy we believe due to heavy bleeding from fibroids. She denies any new musculoskeletal or joint aches or pains. A complete review of systems is obtained and is otherwise negative.     PHYSICAL EXAM:  Unable to assess due to encounter type.   ECOG = 2  0 - Asymptomatic (Fully active, able to carry on all predisease activities without restriction)  1 - Symptomatic but completely ambulatory (Restricted in physically strenuous activity but ambulatory and  able to carry out work of a light or sedentary nature. For example, light housework, office work)  2 - Symptomatic, <50% in bed during the day (Ambulatory and capable of all self care but unable to carry out any work activities. Up and about more than 50% of waking hours)  3 - Symptomatic, >50% in bed, but not bedbound (Capable of only limited self-care, confined to bed or chair 50% or more of waking hours)  4 - Bedbound (Completely disabled. Cannot carry on any self-care. Totally confined to bed or chair)  5 - Death   Eustace Pen MM, Creech RH, Tormey DC, et al. [phone]). "Toxicity and response criteria of the [facility]". [place] Oncol. 5 (6): 649-55    LABORATORY DATA:  Lab Results  Component Value Date   WBC 7.9 08/19/2019   HGB 10.9 (L) 08/19/2019   HCT 33.9 (L) 08/19/2019   MCV 90.2 08/19/2019   PLT 201 08/19/2019   Lab Results  Component Value Date   NA 144 08/19/2019   K 3.7 08/19/2019   CL 108 08/19/2019   CO2 25 08/19/2019   Lab Results  Component Value Date   ALT 20  08/19/2019   AST 31 08/19/2019   ALKPHOS 96 08/19/2019   BILITOT 0.4 08/19/2019      RADIOGRAPHY: CT CHEST W CONTRAST  Result Date: 08/11/2019 CLINICAL DATA:  Rectal mass on colonoscopy today. EXAM: CT CHEST, ABDOMEN, AND PELVIS WITH CONTRAST TECHNIQUE: Multidetector CT imaging of the chest, abdomen and pelvis was performed following the standard protocol during bolus administration of intravenous contrast. CONTRAST:  70mL OMNIPAQUE IOHEXOL 300 MG/ML  SOLN COMPARISON:  None. FINDINGS: CT CHEST FINDINGS Cardiovascular: The heart is within normal limits in size for age. There is moderate left atrial enlargement noted. No pericardial effusion. Scattered aortic calcifications and coronary artery calcifications are noted. The pulmonary arteries appear grossly normal. Extensive left-sided chest wall collateral vessels are noted. I suspect there may be some stenosis of the left brachiocephalic vein due to the pacer wires. Mediastinum/Nodes: No mediastinal or hilar mass or adenopathy. The esophagus is grossly normal. Lungs/Pleura: Emphysematous changes and fairly significant pulmonary scarring along with areas of bronchiectasis. No superimposed infiltrates or effusions. No worrisome pulmonary lesions. No pulmonary nodules to suggest pulmonary metastatic disease. No pleural effusions. Chest wall/musculoskeletal: No breast masses, supraclavicular or axillary adenopathy. Enlarged thyroid gland with multiple thyroid nodules. None of these measures larger than 8 mm. This is likely multinodular goiter. CT ABDOMEN PELVIS FINDINGS Hepatobiliary: No focal hepatic lesions to suggest hepatic metastatic disease. No intra or extrahepatic biliary dilatation. The gallbladder appears normal. Pancreas: No mass, inflammation or ductal dilatation. Spleen: Normal in size. No focal lesions. Numerous calcified granulomas. Adrenals/Urinary Tract: The adrenal glands are unremarkable. Bilateral renal cysts are noted. 17 mm indeterminate  lesion projecting off the midpole region of the left kidney posteriorly. This demonstrates high attenuation (68 Hounsfield units). It could be an enhancing solid renal lesion or a hemorrhagic cyst. Slightly more inferior and lateral to this lesion is a 15 mm lesion which measures 37 Hounsfield units and also could be a hemorrhagic cyst. 16 mm hyperdense lesion projecting off the medial cortex of the left kidney in the midpole region on image 58/3 is also indeterminate. Hyperdense lesion projecting off the midpole region of the right kidney laterally measures 73 Hounsfield units and is indeterminate. The bladder is unremarkable. Stomach/Bowel: The stomach, duodenum and small bowel are unremarkable. No acute inflammatory changes, mass lesions or  obstructive findings. Diffuse colonic diverticulosis without findings for acute diverticulitis. Ill-defined right-sided rectal wall thickening consistent with patient's known rectal cancer. No obvious perirectal involvement. No perirectal adenopathy or pelvic sidewall adenopathy the. No enlarged nodes in the sigmoid mesocolon. Vascular/Lymphatic: Advanced atherosclerotic calcifications involving the aorta iliac arteries but no aneurysm or dissection. The branch vessels are patent. The major venous structures are patent. No mesenteric or retroperitoneal lymphadenopathy. Small medial and lateral external iliac artery lymph nodes are noted. 7 mm lymph node on image 99/3. 5 mm left-sided node on image 99/3. 5 mm right lateral external iliac node on image 98/3. 11 mm right common femoral node on image 104/3. Left lateral external iliac node on image 100/3. 13 mm right inguinal lymph node on image number 111/3. Reproductive: The uterus is surgically absent. Both ovaries are still present and appear normal. Other: No free pelvic fluid collections. Small periumbilical abdominal wall hernia containing fat. Musculoskeletal: No significant bony findings. IMPRESSION: 1. Ill-defined  right-sided rectal mass correlating with the patient's known rectal cancer. No perirectal adenopathy or sigmoid mesocolon adenopathy. 2. Small/borderline iliac lymph nodes bilaterally as detailed above. There is also a 13 mm right inguinal lymph node. 3. No findings for hepatic metastatic disease and no retroperitoneal adenopathy. 4. No CT findings for pulmonary metastatic disease. 5. Four indeterminate renal lesions, most likely hyperdense cysts but will require further evaluation. Recommend MRI abdomen without and with contrast when able. 6. Colonic diverticulosis but no other colonic lesions are identified. 7. Multinodular thyroid goiter. Electronically Signed   By: Marijo Sanes M.D.   On: 08/11/2019 14:39   CT ABDOMEN PELVIS W CONTRAST  Result Date: 08/11/2019 CLINICAL DATA:  Rectal mass on colonoscopy today. EXAM: CT CHEST, ABDOMEN, AND PELVIS WITH CONTRAST TECHNIQUE: Multidetector CT imaging of the chest, abdomen and pelvis was performed following the standard protocol during bolus administration of intravenous contrast. CONTRAST:  34mL OMNIPAQUE IOHEXOL 300 MG/ML  SOLN COMPARISON:  None. FINDINGS: CT CHEST FINDINGS Cardiovascular: The heart is within normal limits in size for age. There is moderate left atrial enlargement noted. No pericardial effusion. Scattered aortic calcifications and coronary artery calcifications are noted. The pulmonary arteries appear grossly normal. Extensive left-sided chest wall collateral vessels are noted. I suspect there may be some stenosis of the left brachiocephalic vein due to the pacer wires. Mediastinum/Nodes: No mediastinal or hilar mass or adenopathy. The esophagus is grossly normal. Lungs/Pleura: Emphysematous changes and fairly significant pulmonary scarring along with areas of bronchiectasis. No superimposed infiltrates or effusions. No worrisome pulmonary lesions. No pulmonary nodules to suggest pulmonary metastatic disease. No pleural effusions. Chest  wall/musculoskeletal: No breast masses, supraclavicular or axillary adenopathy. Enlarged thyroid gland with multiple thyroid nodules. None of these measures larger than 8 mm. This is likely multinodular goiter. CT ABDOMEN PELVIS FINDINGS Hepatobiliary: No focal hepatic lesions to suggest hepatic metastatic disease. No intra or extrahepatic biliary dilatation. The gallbladder appears normal. Pancreas: No mass, inflammation or ductal dilatation. Spleen: Normal in size. No focal lesions. Numerous calcified granulomas. Adrenals/Urinary Tract: The adrenal glands are unremarkable. Bilateral renal cysts are noted. 17 mm indeterminate lesion projecting off the midpole region of the left kidney posteriorly. This demonstrates high attenuation (68 Hounsfield units). It could be an enhancing solid renal lesion or a hemorrhagic cyst. Slightly more inferior and lateral to this lesion is a 15 mm lesion which measures 37 Hounsfield units and also could be a hemorrhagic cyst. 16 mm hyperdense lesion projecting off the medial cortex of the left kidney  in the midpole region on image 58/3 is also indeterminate. Hyperdense lesion projecting off the midpole region of the right kidney laterally measures 73 Hounsfield units and is indeterminate. The bladder is unremarkable. Stomach/Bowel: The stomach, duodenum and small bowel are unremarkable. No acute inflammatory changes, mass lesions or obstructive findings. Diffuse colonic diverticulosis without findings for acute diverticulitis. Ill-defined right-sided rectal wall thickening consistent with patient's known rectal cancer. No obvious perirectal involvement. No perirectal adenopathy or pelvic sidewall adenopathy the. No enlarged nodes in the sigmoid mesocolon. Vascular/Lymphatic: Advanced atherosclerotic calcifications involving the aorta iliac arteries but no aneurysm or dissection. The branch vessels are patent. The major venous structures are patent. No mesenteric or retroperitoneal  lymphadenopathy. Small medial and lateral external iliac artery lymph nodes are noted. 7 mm lymph node on image 99/3. 5 mm left-sided node on image 99/3. 5 mm right lateral external iliac node on image 98/3. 11 mm right common femoral node on image 104/3. Left lateral external iliac node on image 100/3. 13 mm right inguinal lymph node on image number 111/3. Reproductive: The uterus is surgically absent. Both ovaries are still present and appear normal. Other: No free pelvic fluid collections. Small periumbilical abdominal wall hernia containing fat. Musculoskeletal: No significant bony findings. IMPRESSION: 1. Ill-defined right-sided rectal mass correlating with the patient's known rectal cancer. No perirectal adenopathy or sigmoid mesocolon adenopathy. 2. Small/borderline iliac lymph nodes bilaterally as detailed above. There is also a 13 mm right inguinal lymph node. 3. No findings for hepatic metastatic disease and no retroperitoneal adenopathy. 4. No CT findings for pulmonary metastatic disease. 5. Four indeterminate renal lesions, most likely hyperdense cysts but will require further evaluation. Recommend MRI abdomen without and with contrast when able. 6. Colonic diverticulosis but no other colonic lesions are identified. 7. Multinodular thyroid goiter. Electronically Signed   By: Marijo Sanes M.D.   On: 08/11/2019 14:39       IMPRESSION/PLAN: 1. Squamous Cell Carcinoma of the Anus. Dr. Lisbeth Renshaw discusses the pathology findings and reviews the nature of locally advanced anal cancer. He discusses that in patients who are not candidates for chemotherapy, options would be to either consider definitive radiotherapy alone versus a palliative course of therapy. After discussing her situation, and performance status, it appears that she would be a better candidate for a palliative course of therapy. We discussed the risks, benefits, short, and long term effects of radiotherapy, and the patient would like a few  days to consider her options prior to proceeding. Dr. Lisbeth Renshaw discusses the delivery and logistics of radiotherapy and anticipates a course of 1 week of radiotherapy. We will contact her next week to determine how she would like to proceed. 2.  ICD in situ. She will follow up with her cardiologist in [place] this week and is also planning to have a consultation in [place] with Dr. Daneen Schick as well.  We will follow-up with her main cardiologist as well for authorization to move forward with radiation given her in situ pacemaker.   Given current concerns for patient exposure during the COVID-19 pandemic, this encounter was conducted via telephone.  The patient has given verbal consent for this type of encounter. The time spent during this encounter was 90 minutes and 50% of that time was spent in the coordination of her care. The attendants for this meeting include Dr. Lisbeth Renshaw, Shona Simpson, [facility] and Dondra Spry and her sister Hyman Hopes During the encounter, Dr. Lisbeth Renshaw and Shona Simpson [facility] were located at [facility] Department.  Dondra Spry and her sister Hyman Hopes were located in [place] at [place] home.   The above documentation reflects my direct findings during this shared patient visit. Please see the separate note by Dr. Lisbeth Renshaw on this date for the remainder of the patient's plan of care.    Carola Rhine, PAC

## 2019-08-24 ENCOUNTER — Telehealth: Payer: Self-pay | Admitting: Radiation Oncology

## 2019-08-25 ENCOUNTER — Telehealth: Payer: Self-pay | Admitting: General Practice

## 2019-08-25 ENCOUNTER — Encounter: Payer: Self-pay | Admitting: General Practice

## 2019-08-25 NOTE — Progress Notes (Signed)
[facility] Psychosocial Distress Screening Clinical Social Work  Clinical Social Work was referred by distress screening protocol.  The patient scored a 9 on the Psychosocial Distress Thermometer which indicates severe distress. Clinical Social Worker contacted patient by phone to assess for distress and other psychosocial needs. Unable to reach patient, left VM requesting call back at her convenience.    ONCBCN DISTRESS SCREENING 08/23/2019  Screening Type Initial Screening  Distress experienced in past week (1-10) 9  Practical problem type Housing  Other Contact via phone   Clinical Social Worker follow up needed: Yes.    Await return call.    If yes, follow up plan:  Beverely Pace, [place], LCSW Clinical Social Worker Phone:  [phone]

## 2019-08-25 NOTE — Telephone Encounter (Signed)
[facility] CSW Progress Notes  Return call from patient.  She is confused by Distress Screen, does not remember completing the screen or indicating she had housing needs.  Staying with sister in [place], will return home to [place]  States she needs help with finding "someone to help with the cooking."  Says [place] get Meals on Wheels."  CSW will investigate options for patient - will let patient know of resources if any exist for her.  Otherwise patient reports that all her needs are met, no concerns.    Edwyna Shell, LCSW Clinical Social Worker Phone:  [phone]

## 2019-08-31 NOTE — Telephone Encounter (Signed)
I called the patient's sister Gregary Signs to follow up with our previous conversation and she was there with the patient. The patient still wants to proceed with radiotherapy after our discussion and elects for the 1 week treatment. She would like to wait to see Dr. Tamala Julian prior to starting treatment and we will appreciate his input regarding her ICD. She was given an simulation appt on 09/22/19 for treatment planning of her anal cancer.

## 2019-09-07 ENCOUNTER — Ambulatory Visit: Payer: Medicare Other | Admitting: Podiatry

## 2019-09-15 ENCOUNTER — Telehealth: Payer: Self-pay | Admitting: Hematology and Oncology

## 2019-09-15 ENCOUNTER — Ambulatory Visit: Payer: Medicare Other | Admitting: Podiatry

## 2019-09-15 NOTE — Telephone Encounter (Signed)
Rescheduled per 2/18. Called and spoke with pt, confirmed 2/24 appt

## 2019-09-16 ENCOUNTER — Other Ambulatory Visit: Payer: Medicare Other

## 2019-09-16 ENCOUNTER — Ambulatory Visit: Payer: Medicare Other | Admitting: Hematology and Oncology

## 2019-09-18 ENCOUNTER — Ambulatory Visit: Payer: Medicare Other | Attending: Internal Medicine

## 2019-09-18 DIAGNOSIS — Z23 Encounter for immunization: Secondary | ICD-10-CM | POA: Insufficient documentation

## 2019-09-18 NOTE — Progress Notes (Signed)
   Covid-19 Vaccination Clinic  Name:  Candice Lynn    MRN: [id number] DOB: January 10, 1941  09/18/2019  Candice Lynn was observed post Covid-19 immunization for 15 minutes without incidence. She was provided with Vaccine Information Sheet and instruction to access the V-Safe system.   Candice Lynn was instructed to call 911 with any severe reactions post vaccine: Marland Kitchen Difficulty breathing  . Swelling of your face and throat  . A fast heartbeat  . A bad rash all over your body  . Dizziness and weakness    Immunizations Administered    Name Date Dose VIS Date Route   Pfizer COVID-19 Vaccine 09/18/2019  1:53 PM 0.3 mL 07/08/2019 Intramuscular   Manufacturer: [place]   Lot: [id number]   [place]: [id number]
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
## 2019-09-20 ENCOUNTER — Other Ambulatory Visit: Payer: Self-pay

## 2019-09-20 ENCOUNTER — Other Ambulatory Visit: Payer: Self-pay | Admitting: Hematology and Oncology

## 2019-09-20 ENCOUNTER — Ambulatory Visit (INDEPENDENT_AMBULATORY_CARE_PROVIDER_SITE_OTHER): Payer: Medicare Other | Admitting: Interventional Cardiology

## 2019-09-20 ENCOUNTER — Encounter: Payer: Self-pay | Admitting: Interventional Cardiology

## 2019-09-20 VITALS — BP 138/82 | HR 91 | Ht 60.0 in | Wt 173.1 lb

## 2019-09-20 DIAGNOSIS — D500 Iron deficiency anemia secondary to blood loss (chronic): Secondary | ICD-10-CM

## 2019-09-20 DIAGNOSIS — I10 Essential (primary) hypertension: Secondary | ICD-10-CM | POA: Diagnosis not present

## 2019-09-20 DIAGNOSIS — I2510 Atherosclerotic heart disease of native coronary artery without angina pectoris: Secondary | ICD-10-CM

## 2019-09-20 DIAGNOSIS — I340 Nonrheumatic mitral (valve) insufficiency: Secondary | ICD-10-CM

## 2019-09-20 DIAGNOSIS — I5030 Unspecified diastolic (congestive) heart failure: Secondary | ICD-10-CM

## 2019-09-20 DIAGNOSIS — E1122 Type 2 diabetes mellitus with diabetic chronic kidney disease: Secondary | ICD-10-CM

## 2019-09-20 DIAGNOSIS — C210 Malignant neoplasm of anus, unspecified: Secondary | ICD-10-CM

## 2019-09-20 DIAGNOSIS — I471 Supraventricular tachycardia: Secondary | ICD-10-CM

## 2019-09-20 DIAGNOSIS — N183 Chronic kidney disease, stage 3 unspecified: Secondary | ICD-10-CM

## 2019-09-20 DIAGNOSIS — Z7189 Other specified counseling: Secondary | ICD-10-CM

## 2019-09-20 DIAGNOSIS — N1831 Chronic kidney disease, stage 3a: Secondary | ICD-10-CM

## 2019-09-20 NOTE — Patient Instructions (Signed)
Medication Instructions:  Your physician recommends that you continue on your current medications as directed. Please refer to the Current Medication list given to you today.  *If you need a refill on your cardiac medications before your next appointment, please call your pharmacy*  Lab Work: None If you have labs (blood work) drawn today and your tests are completely normal, you will receive your results only by: . MyChart Message (if you have MyChart) OR . A paper copy in the mail If you have any lab test that is abnormal or we need to change your treatment, we will call you to review the results.  Testing/Procedures: None  Follow-Up: At [facility], you and your health needs are our priority.  As part of our continuing mission to provide you with exceptional heart care, we have created designated Provider Care Teams.  These Care Teams include your primary Cardiologist (physician) and Advanced Practice Providers (APPs -  Physician Assistants and Nurse Practitioners) who all work together to provide you with the care you need, when you need it.  Your next appointment:   As needed   The format for your next appointment:   In Person  Provider:   You may see Dr. Henry Smith or one of the following Advanced Practice Providers on your designated Care Team:    Lori Gerhardt, NP  Laura Ingold, NP  Jill McDaniel, NP   Other Instructions   

## 2019-09-21 ENCOUNTER — Encounter: Payer: Self-pay | Admitting: Hematology and Oncology

## 2019-09-21 ENCOUNTER — Other Ambulatory Visit: Payer: Self-pay | Admitting: Hematology and Oncology

## 2019-09-21 ENCOUNTER — Inpatient Hospital Stay: Payer: Medicare Other | Attending: Hematology and Oncology

## 2019-09-21 ENCOUNTER — Other Ambulatory Visit: Payer: Self-pay

## 2019-09-21 ENCOUNTER — Inpatient Hospital Stay (HOSPITAL_BASED_OUTPATIENT_CLINIC_OR_DEPARTMENT_OTHER): Payer: Medicare Other | Admitting: Hematology and Oncology

## 2019-09-21 VITALS — BP 144/68 | HR 60 | Temp 98.2°F | Resp 18 | Ht 60.0 in | Wt 174.0 lb

## 2019-09-21 DIAGNOSIS — E1122 Type 2 diabetes mellitus with diabetic chronic kidney disease: Secondary | ICD-10-CM | POA: Diagnosis not present

## 2019-09-21 DIAGNOSIS — D500 Iron deficiency anemia secondary to blood loss (chronic): Secondary | ICD-10-CM

## 2019-09-21 DIAGNOSIS — I130 Hypertensive heart and chronic kidney disease with heart failure and stage 1 through stage 4 chronic kidney disease, or unspecified chronic kidney disease: Secondary | ICD-10-CM | POA: Insufficient documentation

## 2019-09-21 DIAGNOSIS — K5900 Constipation, unspecified: Secondary | ICD-10-CM | POA: Diagnosis not present

## 2019-09-21 DIAGNOSIS — C211 Malignant neoplasm of anal canal: Secondary | ICD-10-CM | POA: Diagnosis present

## 2019-09-21 DIAGNOSIS — N183 Chronic kidney disease, stage 3 unspecified: Secondary | ICD-10-CM | POA: Insufficient documentation

## 2019-09-21 DIAGNOSIS — C210 Malignant neoplasm of anus, unspecified: Secondary | ICD-10-CM | POA: Diagnosis not present

## 2019-09-21 DIAGNOSIS — K625 Hemorrhage of anus and rectum: Secondary | ICD-10-CM | POA: Diagnosis not present

## 2019-09-21 DIAGNOSIS — I509 Heart failure, unspecified: Secondary | ICD-10-CM | POA: Insufficient documentation

## 2019-09-21 DIAGNOSIS — I2510 Atherosclerotic heart disease of native coronary artery without angina pectoris: Secondary | ICD-10-CM | POA: Insufficient documentation

## 2019-09-21 LAB — CBC WITH DIFFERENTIAL (CANCER CENTER ONLY)
Abs Immature Granulocytes: 0.01 10*3/uL (ref 0.00–0.07)
Basophils Absolute: 0 10*3/uL (ref 0.0–0.1)
Basophils Relative: 0 %
Eosinophils Absolute: 0.1 10*3/uL (ref 0.0–0.5)
Eosinophils Relative: 2 %
HCT: 38.4 % (ref 36.0–46.0)
Hemoglobin: 12.3 g/dL (ref 12.0–15.0)
Immature Granulocytes: 0 %
Lymphocytes Relative: 19 %
Lymphs Abs: 1.1 10*3/uL (ref 0.7–4.0)
MCH: 28.7 pg (ref 26.0–34.0)
MCHC: 32 g/dL (ref 30.0–36.0)
MCV: 89.5 fL (ref 80.0–100.0)
Monocytes Absolute: 0.5 10*3/uL (ref 0.1–1.0)
Monocytes Relative: 9 %
Neutro Abs: 4.3 10*3/uL (ref 1.7–7.7)
Neutrophils Relative %: 70 %
Platelet Count: 170 10*3/uL (ref 150–400)
RBC: 4.29 MIL/uL (ref 3.87–5.11)
RDW: 16.4 % — ABNORMAL HIGH (ref 11.5–15.5)
WBC Count: 6.1 10*3/uL (ref 4.0–10.5)
nRBC: 0 % (ref 0.0–0.2)

## 2019-09-21 LAB — CMP (CANCER CENTER ONLY)
ALT: 8 U/L (ref 0–44)
AST: 16 U/L (ref 15–41)
Albumin: 3.4 g/dL — ABNORMAL LOW (ref 3.5–5.0)
Alkaline Phosphatase: 103 U/L (ref 38–126)
Anion gap: 8 (ref 5–15)
BUN: 21 mg/dL (ref 8–23)
CO2: 27 mmol/L (ref 22–32)
Calcium: 8.9 mg/dL (ref 8.9–10.3)
Chloride: 107 mmol/L (ref 98–111)
Creatinine: 1.48 mg/dL — ABNORMAL HIGH (ref 0.44–1.00)
GFR, Est AFR Am: 39 mL/min — ABNORMAL LOW (ref >60)
GFR, Estimated: 34 mL/min — ABNORMAL LOW (ref >60)
Glucose, Bld: 159 mg/dL — ABNORMAL HIGH (ref 70–99)
Potassium: 4 mmol/L (ref 3.5–5.1)
Sodium: 142 mmol/L (ref 135–145)
Total Bilirubin: 0.4 mg/dL (ref 0.3–1.2)
Total Protein: 7.3 g/dL (ref 6.5–8.1)

## 2019-09-21 MED ORDER — SENNOSIDES-DOCUSATE SODIUM 8.6-50 MG PO TABS: 2.0000 | ORAL_TABLET | Freq: Two times a day (BID) | ORAL | 1 refills | Status: AC

## 2019-09-21 NOTE — Progress Notes (Signed)
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 Hgb 11.8 2)  08/10/2019: Hgb dropped to 9.9.  3)  08/11/2019: patient underwent colonoscopy showing rectal mass in the distal rectum. Biopsy confirmed invasive squamous cel cancer. Blood throughout the entire colon concerning for concomitant right sided diverticular bleeding 4)  08/11/2019: CT C/A/P revealed right-sided rectal mass correlating with the patient's known rectal cancer. Also had several small medial and lateral external iliac artery lymph nodes, including a 13 mm right inguinal lymph node. 5) 08/15/2019: establish care with Dr. Lorenso Courier while inpatient.   Interval History:  Candice Lynn 79 y.o. female with medical history significant for localized anal squamous cell cancer who presents for a follow up visit. The patient was last seen in the in clinic on 08/19/2019 . In the interim since the last visit Candice Lynn has had no hospitalizations or ED visits. She has met with radiation oncology who are planning to do a 1 week course of palliative radiation.   On exam today Candice Lynn notes that she feels well.  She notes that she has been "walking like an old woman" and can only walk approximately 10 to 20 feet and typically requires something to hold onto while she is walking.  She is currently having with her sister who has the bathroom on the second floor and therefore she has been having trouble getting up and down the stairs to use the restroom.  She notes that her appetite is good, but that she has been constipated since her last visit.  She has been taking her MiraLAX with little relief of this constipation.  She does note that she is not having any pain, but that she does have  occasional blood in her bowel movements.  Otherwise her weight has been stable and she had no additional concerns today.  A full 10 point ROS is listed below.  MEDICAL HISTORY:  Past Medical History:  Diagnosis Date  . Apnea   . Benign neoplasm of ascending colon   . Benign neoplasm of sigmoid colon   . Benign neoplasm of transverse colon   . CAD (coronary artery disease)    non-obstructive  . CHF (congestive heart failure) ([place])   . CKD (chronic kidney disease), stage III   . Colon polyps 08/16/2019  . Diabetes mellitus without complication ([place])   . Diverticulosis 08/16/2019  . GI bleed 07/2019   LOWER GI  . Hematochezia 08/10/2019  . Hematuria 08/10/2019  . HTN (hypertension) 08/10/2019  . Hypercholesterolemia   . Hypertension   . Lower GI bleed 08/10/2019  . PAT (paroxysmal atrial tachycardia) ([place])   . Rectal mass   . 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 Type 2 diabetes mellitus ([place]) 08/10/2019    SURGICAL HISTORY: Past Surgical History:  Procedure Laterality Date  . ABDOMINAL HYSTERECTOMY    . BIOPSY  08/11/2019   Procedure: BIOPSY;  Surgeon: Jerene Bears, MD;  Location: [facility] ENDOSCOPY;  Service: Gastroenterology;;  . COLONOSCOPY  08/11/2019  . COLONOSCOPY WITH PROPOFOL N/A 08/11/2019   Procedure: COLONOSCOPY WITH PROPOFOL;  Surgeon: Jerene Bears, MD;  Location: [facility];  Service: Gastroenterology;  Laterality: N/A;  . PACEMAKER IMPLANT    . POLYPECTOMY  08/11/2019   Procedure: POLYPECTOMY;  Surgeon: Jerene Bears, MD;  Location: [facility] ENDOSCOPY;  Service: Gastroenterology;;    SOCIAL HISTORY: Social History   Socioeconomic History  . Marital status: Single    Spouse name: Not on file  . Number of children: Not on file  . Years of education: Not on file  . Highest education level: Not on file  Occupational History  . Not on file  Tobacco Use  . Smoking status: Never Smoker  . Smokeless tobacco: Never Used  Substance and Sexual Activity  .  Alcohol use: No  . Drug use: No  . Sexual activity: Not on file  Other Topics Concern  . Not on file  Social History Narrative  . Not on file   Social Determinants of Health   Financial Resource Strain:   . Difficulty of Paying Living Expenses: Not on file  Food Insecurity:   . Worried About Charity fundraiser in the Last Year: Not on file  . Ran Out of Food in the Last Year: Not on file  Transportation Needs:   . Lack of Transportation (Medical): Not on file  . Lack of Transportation (Non-Medical): Not on file  Physical Activity:   . Days of Exercise per Week: Not on file  . Minutes of Exercise per Session: Not on file  Stress:   . Feeling of Stress : Not on file  Social Connections:   . Frequency of Communication with Friends and Family: Not on file  . Frequency of Social Gatherings with Friends and Family: Not on file  . Attends Religious Services: Not on file  . Active Member of Clubs or Organizations: Not on file  . Attends Archivist Meetings: Not on file  . Marital Status: Not on file  Intimate Partner Violence:   . Fear of Current or Ex-Partner: Not on file  . Emotionally Abused: Not on file  . Physically Abused: Not on file  . Sexually Abused: Not on file    FAMILY HISTORY: No family history on file.  ALLERGIES:  is allergic to shellfish allergy.  MEDICATIONS:  Current Outpatient Medications  Medication Sig Dispense Refill  . acetaminophen (TYLENOL) 500 MG tablet Take 500 mg by mouth every 6 (six) hours as needed for mild pain.    Marland Kitchen allopurinol (ZYLOPRIM) 300 MG tablet Take 300 mg by mouth daily.    Marland Kitchen aspirin EC 81 MG tablet Take 81 mg by mouth daily.    Marland Kitchen atorvastatin (LIPITOR) 20 MG tablet Take 20 mg by mouth at bedtime.    Marland Kitchen CARTIA XT 180 MG 24 hr capsule Take 180 mg by mouth daily.    Mila Homer Mask Small MISC Place into the nose.    . ferrous sulfate 325 (65 FE) MG tablet Take 325 mg by mouth daily with breakfast.    . furosemide (LASIX)  20 MG tablet Take 1 tablet (20 mg total) by mouth daily. Take extra dose if needed for leg swellings/shortness of breath or weight gain >5 pounds in 48 hrs 30 tablet 0  . metoprolol tartrate (LOPRESSOR) 50 MG tablet Take 50 mg by mouth 2 (two) times daily.    . polyethylene glycol (MIRALAX / GLYCOLAX) 17 g packet Take 17 g by mouth daily. 14 each 0   No current facility-administered medications for this visit.    REVIEW OF SYSTEMS:   Constitutional: ( - ) fevers, ( - )  chills , ( - ) night sweats Eyes: ( - ) blurriness of vision, ( - )  double vision, ( - ) watery eyes Ears, nose, mouth, throat, and face: ( - ) mucositis, ( - ) sore throat Respiratory: ( - ) cough, ( - ) dyspnea, ( - ) wheezes Cardiovascular: ( - ) palpitation, ( - ) chest discomfort, ( - ) lower extremity swelling Gastrointestinal:  ( - ) nausea, ( - ) heartburn, (+) constipation Skin: ( - ) abnormal skin rashes Lymphatics: ( - ) new lymphadenopathy, ( - ) easy bruising Neurological: ( - ) numbness, ( - ) tingling, ( - ) new weaknesses Behavioral/Psych: ( - ) mood change, ( - ) new changes  All other systems were reviewed with the patient and are negative.  PHYSICAL EXAMINATION: ECOG PERFORMANCE STATUS: 3 - Symptomatic, >50% confined to bed  Vitals:   09/21/19 0952  BP: (!) 144/68  Pulse: 60  Resp: 18  Temp: 98.2 F (36.8 C)  SpO2: 100%   Filed Weights   09/21/19 0952  Weight: 174 lb (78.9 kg)    GENERAL: well appearing elderly African American female in a wheelchair, in no distress and comfortable SKIN: skin color, texture, turgor are normal, no rashes or significant lesions EYES: conjunctiva are pink and non-injected, sclera clear LUNGS: clear to auscultation and percussion with normal breathing effort HEART: regular rate & rhythm and no murmurs and no lower extremity edema Musculoskeletal: no cyanosis of digits and no clubbing  PSYCH: alert & oriented x 3, fluent speech NEURO: no focal motor/sensory  deficits  LABORATORY DATA:  I have reviewed the data as listed CBC Latest Ref Rng & Units 09/21/2019 08/19/2019 08/15/2019  WBC 4.0 - 10.5 K/uL 6.1 7.9 6.6  Hemoglobin 12.0 - 15.0 g/dL 12.3 10.9(L) 9.8(L)  Hematocrit 36.0 - 46.0 % 38.4 33.9(L) 29.9(L)  Platelets 150 - 400 K/uL 170 201 123(L)    CMP Latest Ref Rng & Units 09/21/2019 08/19/2019 08/15/2019  Glucose 70 - 99 mg/dL 159(H) 99 86  BUN 8 - 23 mg/dL 21 26(H) 32(H)  Creatinine 0.44 - 1.00 mg/dL 1.48(H) 1.39(H) 1.52(H)  Sodium 135 - 145 mmol/L 142 144 140  Potassium 3.5 - 5.1 mmol/L 4.0 3.7 5.4(H)  Chloride 98 - 111 mmol/L 107 108 107  CO2 22 - 32 mmol/L _0 Calcium 8.9 - 10.3 mg/dL 8.9 8.8(L) 8.5(L)  Total Protein 6.5 - 8.1 g/dL 7.3 7.9 5.9(L)  Total Bilirubin 0.3 - 1.2 mg/dL 0.4 0.4 0.5  Alkaline Phos 38 - 126 U/L 103 96 67  AST 15 - 41 U/L _1 ALT 0 - 44 U/L _2 RADIOGRAPHIC STUDIES: No interval imaging.  No results found.  ASSESSMENT & PLAN Tearra Ouk 79 y.o. female with medical history significant for localized anal squamous cell cancer who presents for a follow up visit.  In the interim since her last visit Candice Lynn has discussed her care with radiation oncology.  They are currently willing to offer a 1 week course of palliative radiation in order to help shrink the tumor.  I reemphasized today that this is a palliative measure and that radiation alone would not be a curative treatment.  I also spoke with her about my concerns regarding systemic chemotherapy in conjunction with radiation therapy.  I noted that that would be a 6-week course of radiation and chemotherapy with mitomycin-C and 5-FU.  I am deeply concerned with her state of deconditioning that she would not be able to tolerate this treatment course given her poor functional status  and multiple comorbidities.  The patient's sister and son (via phone) were involved with the discussion and were in agreement that palliative radiation would be  the best treatment choice for her moving forward.  At the moment she is fortunately asymptomatic and therefore I do not think is necessary to consult palliative care at this time.  She is not having any pain, but having some light blood in her stool.  Her hemoglobin levels have returned to normal with p.o. iron supplementation.  Additionally she is having some constipation which she has been trying home MiraLAX to resolve, but given that that has not improved her symptoms I would recommend a twice daily dosing of senna docusate.  The patient is reported that after she is completed radiation therapy she would like to return home to [facility].  As such we will have her establish with the cancer center in [facility] for further monitoring after we have completed her treatment here at the [facility] health cancer center.   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 I think a palliative approach would be most appropriate. The patient and her sister are agreeable to this plan. --agree with short course of radiation per Radiation oncology for palliative purposes.  --can consider consultation with palliative care if patient were to become more symptomatic. Will hold on consult at this time.  --after radiation, the patient notes she would want to return home to [place], [place]. The closest cancer center there would be [facility] in [place].  --patient is currently asymptomatic other than light rectal bleeding and not having any issues with pain or weight loss. Will continue to monitor  --f/u in 3 months time.  #Rectal Bleeding, stable --secondary to rectal mass, reportedly improved from hospitalization --continue to take PO iron 3104m daily, Hgb has returned to normal range --repeat CBC today and on f/u  visits.  #Constipation --continue home miralax PRN --will add on senna-docusate BID  No orders of the defined types were placed in this encounter.  All questions were answered. The patient knows to call the clinic with any problems, questions or concerns.  A total of more than 30 minutes were spent on this encounter and over half of that time was spent on counseling and coordination of care as outlined above.   JLedell Peoples MD Department of Hematology/Oncology CSalmonat WSaint [facility]Ph[facility], IncPhone: [phone]: [phone]: [email or web address]  09/21/2019 10:22 AM

## 2019-09-22 ENCOUNTER — Telehealth: Payer: Self-pay | Admitting: Hematology and Oncology

## 2019-09-22 ENCOUNTER — Other Ambulatory Visit: Payer: Self-pay

## 2019-09-22 ENCOUNTER — Ambulatory Visit: Payer: Medicare Other | Admitting: Podiatry

## 2019-09-22 ENCOUNTER — Ambulatory Visit
Admission: RE | Admit: 2019-09-22 | Discharge: 2019-09-22 | Disposition: A | Discharge status: Home / Self Care | Payer: Medicare Other | Source: Ambulatory Visit | Attending: Radiation Oncology | Admitting: Radiation Oncology

## 2019-09-22 DIAGNOSIS — C211 Malignant neoplasm of anal canal: Secondary | ICD-10-CM | POA: Diagnosis not present

## 2019-09-22 DIAGNOSIS — C210 Malignant neoplasm of anus, unspecified: Secondary | ICD-10-CM

## 2019-09-22 NOTE — Telephone Encounter (Signed)
Scheduled 3/24 appt per 2/24 los. Left voicemail with appt details and mailed reminder letter and calender.

## 2019-09-27 DIAGNOSIS — C211 Malignant neoplasm of anal canal: Secondary | ICD-10-CM | POA: Insufficient documentation

## 2019-09-29 ENCOUNTER — Ambulatory Visit
Admission: RE | Admit: 2019-09-29 | Discharge: 2019-09-29 | Disposition: A | Discharge status: Home / Self Care | Payer: Medicare Other | Source: Ambulatory Visit | Attending: Radiation Oncology | Admitting: Radiation Oncology

## 2019-09-29 ENCOUNTER — Other Ambulatory Visit: Payer: Self-pay

## 2019-09-29 DIAGNOSIS — C211 Malignant neoplasm of anal canal: Secondary | ICD-10-CM | POA: Diagnosis not present

## 2019-09-30 ENCOUNTER — Ambulatory Visit
Admission: RE | Admit: 2019-09-30 | Discharge: 2019-09-30 | Disposition: A | Discharge status: Home / Self Care | Payer: Medicare Other | Source: Ambulatory Visit | Attending: Radiation Oncology | Admitting: Radiation Oncology

## 2019-09-30 ENCOUNTER — Other Ambulatory Visit: Payer: Self-pay

## 2019-09-30 DIAGNOSIS — C211 Malignant neoplasm of anal canal: Secondary | ICD-10-CM | POA: Diagnosis not present

## 2019-10-03 ENCOUNTER — Ambulatory Visit
Admission: RE | Admit: 2019-10-03 | Discharge: 2019-10-03 | Disposition: A | Discharge status: Home / Self Care | Payer: Medicare Other | Source: Ambulatory Visit | Attending: Radiation Oncology | Admitting: Radiation Oncology

## 2019-10-03 ENCOUNTER — Other Ambulatory Visit: Payer: Self-pay

## 2019-10-03 DIAGNOSIS — C211 Malignant neoplasm of anal canal: Secondary | ICD-10-CM | POA: Diagnosis not present

## 2019-10-04 ENCOUNTER — Other Ambulatory Visit: Payer: Self-pay

## 2019-10-04 ENCOUNTER — Ambulatory Visit
Admission: RE | Admit: 2019-10-04 | Discharge: 2019-10-04 | Disposition: A | Discharge status: Home / Self Care | Payer: Medicare Other | Source: Ambulatory Visit | Attending: Radiation Oncology | Admitting: Radiation Oncology

## 2019-10-04 DIAGNOSIS — C211 Malignant neoplasm of anal canal: Secondary | ICD-10-CM | POA: Diagnosis not present

## 2019-10-05 ENCOUNTER — Ambulatory Visit
Admission: RE | Admit: 2019-10-05 | Discharge: 2019-10-05 | Disposition: A | Discharge status: Home / Self Care | Payer: Medicare Other | Source: Ambulatory Visit | Attending: Radiation Oncology | Admitting: Radiation Oncology

## 2019-10-05 ENCOUNTER — Other Ambulatory Visit: Payer: Self-pay

## 2019-10-05 ENCOUNTER — Encounter: Payer: Self-pay | Admitting: Radiation Oncology

## 2019-10-05 DIAGNOSIS — C211 Malignant neoplasm of anal canal: Secondary | ICD-10-CM | POA: Diagnosis not present

## 2019-10-05 NOTE — Progress Notes (Signed)
  Radiation Oncology         [phone] ________________________________  Name: Candice Lynn MRN: [id number]  Date: 09/22/2019  DOB: January 22, 1941  SIMULATION AND TREATMENT PLANNING NOTE  DIAGNOSIS:     ICD-10-CM   1. Squamous cell carcinoma of anus (HCC)  C21.0      Site:  pelvis  NARRATIVE:  The patient was brought to the [facility].  Identity was confirmed.  All relevant records and images related to the planned course of therapy were reviewed.   Written consent to proceed with treatment was confirmed which was freely given after reviewing the details related to the planned course of therapy had been reviewed with the patient.  Then, the patient was set-up in a stable reproducible  supine position for radiation therapy.  CT images were obtained.  Surface markings were placed.    Medically necessary complex treatment device(s) for immobilization:  Vac-lock bag.   The CT images were loaded into the planning software.  Then the target and avoidance structures were contoured.  Treatment planning then occurred.  The radiation prescription was entered and confirmed.  A total of 4 complex treatment devices were fabricated which relate to the designed radiation treatment fields. Each of these customized fields/ complex treatment devices will be used on a daily basis during the radiation course. I have requested : 3D Simulation  I have requested a DVH of the following structures: target, rectum, bladder, femoral heads.   The patient will undergo daily image guidance to ensure accurate localization of the target, and adequate minimize dose to the normal surrounding structures in close proximity to the target.   PLAN:  The patient will receive 25 Gy in 5 fractions.  ________________________________   Jodelle Gross, MD, PhD

## 2019-10-06 ENCOUNTER — Encounter (HOSPITAL_COMMUNITY): Payer: Self-pay | Admitting: Cardiology

## 2019-10-06 ENCOUNTER — Other Ambulatory Visit: Payer: Self-pay

## 2019-10-06 ENCOUNTER — Emergency Department (HOSPITAL_COMMUNITY)
Admission: EM | Admit: 2019-10-06 | Discharge: 2019-10-06 | Disposition: A | Discharge status: Home / Self Care | Payer: Medicare Other | Attending: Emergency Medicine | Admitting: Emergency Medicine

## 2019-10-06 ENCOUNTER — Emergency Department (HOSPITAL_COMMUNITY): Payer: Medicare Other

## 2019-10-06 DIAGNOSIS — Z79899 Other long term (current) drug therapy: Secondary | ICD-10-CM | POA: Diagnosis not present

## 2019-10-06 DIAGNOSIS — I25119 Atherosclerotic heart disease of native coronary artery with unspecified angina pectoris: Secondary | ICD-10-CM | POA: Diagnosis not present

## 2019-10-06 DIAGNOSIS — I130 Hypertensive heart and chronic kidney disease with heart failure and stage 1 through stage 4 chronic kidney disease, or unspecified chronic kidney disease: Secondary | ICD-10-CM | POA: Diagnosis not present

## 2019-10-06 DIAGNOSIS — R0789 Other chest pain: Secondary | ICD-10-CM | POA: Diagnosis not present

## 2019-10-06 DIAGNOSIS — R7989 Other specified abnormal findings of blood chemistry: Secondary | ICD-10-CM | POA: Diagnosis not present

## 2019-10-06 DIAGNOSIS — N183 Chronic kidney disease, stage 3 unspecified: Secondary | ICD-10-CM | POA: Diagnosis not present

## 2019-10-06 DIAGNOSIS — I509 Heart failure, unspecified: Secondary | ICD-10-CM | POA: Diagnosis not present

## 2019-10-06 DIAGNOSIS — N1831 Chronic kidney disease, stage 3a: Secondary | ICD-10-CM | POA: Diagnosis not present

## 2019-10-06 DIAGNOSIS — Z7982 Long term (current) use of aspirin: Secondary | ICD-10-CM | POA: Diagnosis not present

## 2019-10-06 DIAGNOSIS — E1122 Type 2 diabetes mellitus with diabetic chronic kidney disease: Secondary | ICD-10-CM | POA: Diagnosis not present

## 2019-10-06 DIAGNOSIS — R079 Chest pain, unspecified: Secondary | ICD-10-CM

## 2019-10-06 DIAGNOSIS — I340 Nonrheumatic mitral (valve) insufficiency: Secondary | ICD-10-CM

## 2019-10-06 DIAGNOSIS — R778 Other specified abnormalities of plasma proteins: Secondary | ICD-10-CM

## 2019-10-06 DIAGNOSIS — Z950 Presence of cardiac pacemaker: Secondary | ICD-10-CM | POA: Diagnosis not present

## 2019-10-06 DIAGNOSIS — Z85048 Personal history of other malignant neoplasm of rectum, rectosigmoid junction, and anus: Secondary | ICD-10-CM | POA: Diagnosis not present

## 2019-10-06 LAB — BASIC METABOLIC PANEL
Anion gap: 9 (ref 5–15)
BUN: 20 mg/dL (ref 8–23)
CO2: 26 mmol/L (ref 22–32)
Calcium: 9 mg/dL (ref 8.9–10.3)
Chloride: 105 mmol/L (ref 98–111)
Creatinine, Ser: 1.7 mg/dL — ABNORMAL HIGH (ref 0.44–1.00)
GFR calc Af Amer: 33 mL/min — ABNORMAL LOW (ref >60)
GFR calc non Af Amer: 29 mL/min — ABNORMAL LOW (ref >60)
Glucose, Bld: 101 mg/dL — ABNORMAL HIGH (ref 70–99)
Potassium: 3.8 mmol/L (ref 3.5–5.1)
Sodium: 140 mmol/L (ref 135–145)

## 2019-10-06 LAB — TROPONIN I (HIGH SENSITIVITY)
Troponin I (High Sensitivity): 166 ng/L (ref <18)
Troponin I (High Sensitivity): 214 ng/L (ref <18)

## 2019-10-06 LAB — CBC
HCT: 40.6 % (ref 36.0–46.0)
Hemoglobin: 12.9 g/dL (ref 12.0–15.0)
MCH: 27.9 pg (ref 26.0–34.0)
MCHC: 31.8 g/dL (ref 30.0–36.0)
MCV: 87.9 fL (ref 80.0–100.0)
Platelets: 174 10*3/uL (ref 150–400)
RBC: 4.62 MIL/uL (ref 3.87–5.11)
RDW: 15.4 % (ref 11.5–15.5)
WBC: 5.6 10*3/uL (ref 4.0–10.5)
nRBC: 0 % (ref 0.0–0.2)

## 2019-10-06 LAB — BRAIN NATRIURETIC PEPTIDE: B Natriuretic Peptide: 329.2 pg/mL — ABNORMAL HIGH (ref 0.0–100.0)

## 2019-10-06 MED ORDER — ISOSORBIDE MONONITRATE ER 30 MG PO TB24: 30.0000 mg | ORAL_TABLET | Freq: Every day | ORAL | 0 refills | Status: AC

## 2019-10-06 MED ORDER — ISOSORBIDE MONONITRATE ER 30 MG PO TB24: 30.0000 mg | ORAL_TABLET | Freq: Every day | ORAL | 1 refills | Status: AC

## 2019-10-06 MED ORDER — SODIUM CHLORIDE 0.9% FLUSH
3.0000 mL | Freq: Once | INTRAVENOUS | Status: AC
  Administered 2019-10-06: 17:00:00 3 mL via INTRAVENOUS

## 2019-10-06 MED ORDER — ASPIRIN 81 MG PO CHEW
243.0000 mg | CHEWABLE_TABLET | Freq: Once | ORAL | Status: AC
  Administered 2019-10-06: 243 mg via ORAL
  Filled 2019-10-06: qty 3

## 2019-10-06 NOTE — Consult Note (Addendum)
Cardiology Consultation:   Patient ID: [name] MRN: [id number]; DOB: April 22, 1941  Admit date: 10/06/2019 Date of Consult: 10/06/2019  Primary Care Provider: Caprice Renshaw, MD Primary Cardiologist: Dr. Jenean Lindau (new [place]) Dr. Tamala Julian [place]) Primary Electrophysiologist:  Dr. Rosezella Florida    Patient Profile:   Candice Lynn is a 79 y.o. female with a hx of severe MR, diastolic HF, CKD 3, DM, HTN, syncope, CHB s/p PPM, and recent diagnosis of squamous cell rectal Ca who is being seen today for the evaluation of chest pain at the request of Dr. Darl Householder.  History of Present Illness:   Candice Lynn is a 79 yo female with PMH noted above. She lives in [place] but currently living here with her sister while undergoing treatment for rectal Ca. She followed by Dr. Jenean Lindau and Dr. Rosezella Florida at [facility].  She has struggled with recurrent syncope for many years.  She had a dual-chamber pacemaker placed back in 2017 after she was found to have complete heart block.  Also carries a diagnosis of severe mitral regurgitation.  She has been referred to the structural heart program at [facility].  Her case was reviewed and felt that secondary to her functional status she was not a candidate for MitraClip at the time.  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 She has been seen by Dr. Marisa Hua with oncology.  Unfortunately due to her comorbidities and poor functional status it was felt she is not a good candidate for chemoradiation.  A palliative care approach was discussed with the patient and her sister who were agreeable to this plan.  She is currently undergoing a short course of radiation for palliative care purposes.  After completing radiation the plan is for her to return to [facility] and continue follow-up with local cancer center there.  At her  last office visit 09/21/2019 she reported being asymptomatic other than light rectal bleeding. She underwent  5 rounds of palliative radiation and has now completed them. In regards to her baseline activity, she says her sister does mostly everything around the home. She gets around independently but does not do much physical activity at all. Has occasional shortness of breath with this minimal activity.    She was seen in the office with Dr. Tamala Julian 09/20/19 to establish with cardiology in the area while living here with her sister. It was felt at that time her mitral valve should be followed medically as she was not experiencing HF symptoms especially given her recent Ca diagnosis.   She presented to the ED on 3/11 with several episodes of chest pain over the past 2 days. Reports experiencing 2 separate episodes while at rest the past 2 days. Both were left sided with no radiation, shortness of breath or nausea. Brief in nature and resolved without intervention. This morning she told her sister about these episodes and she brought her to the ED. She has had no chest pain today prior to or while in the ED. Labs showed stable electrolytes, Cr 1.7, hsTn 166, BNP 329, Hbg 12.9. EKG is a-paced with TWI in inferolateral leads (unchanged from EKG noted back 1/21). CXR without acute edema noted. She is comfortable sitting in the bed at the time of assessment without complaints.     Past Medical History:  Diagnosis Date  . Apnea   . Benign neoplasm of ascending  colon   . Benign neoplasm of sigmoid colon   . Benign neoplasm of transverse colon   . CAD (coronary artery disease)    non-obstructive  . CHF (congestive heart failure) ([place])   . CKD (chronic kidney disease), stage III   . Colon polyps 08/16/2019  . Diabetes mellitus without complication ([place])   . Diverticulosis 08/16/2019  . GI bleed 07/2019   LOWER GI  . Hematochezia 08/10/2019  . Hematuria 08/10/2019  . HTN (hypertension) 08/10/2019  .  Hypercholesterolemia   . Hypertension   . Lower GI bleed 08/10/2019  . PAT (paroxysmal atrial tachycardia) ([place])   . Rectal mass   . 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 Type 2 diabetes mellitus ([place]) 08/10/2019    Past Surgical History:  Procedure Laterality Date  . ABDOMINAL HYSTERECTOMY    . BIOPSY  08/11/2019   Procedure: BIOPSY;  Surgeon: Jerene Bears, MD;  Location: [facility] ENDOSCOPY;  Service: Gastroenterology;;  . COLONOSCOPY  08/11/2019  . COLONOSCOPY WITH PROPOFOL N/A 08/11/2019   Procedure: COLONOSCOPY WITH PROPOFOL;  Surgeon: Jerene Bears, MD;  Location: [facility];  Service: Gastroenterology;  Laterality: N/A;  . PACEMAKER IMPLANT    . POLYPECTOMY  08/11/2019   Procedure: POLYPECTOMY;  Surgeon: Jerene Bears, MD;  Location: [facility] ENDOSCOPY;  Service: Gastroenterology;;     Home Medications:  Prior to Admission medications   Medication Sig Start Date End Date Taking? Authorizing Provider  acetaminophen (TYLENOL) 500 MG tablet Take 500 mg by mouth every 6 (six) hours as needed for mild pain.   Yes [provider]  allopurinol (ZYLOPRIM) 300 MG tablet Take 300 mg by mouth daily.   Yes [provider]  aspirin EC 81 MG tablet Take 81 mg by mouth daily.   Yes [provider]  atorvastatin (LIPITOR) 20 MG tablet Take 20 mg by mouth at bedtime.   Yes [provider]  CARTIA XT 180 MG 24 hr capsule Take 180 mg by mouth daily. 08/08/19  Yes [provider]  ferrous sulfate 325 (65 FE) MG tablet Take 325 mg by mouth daily with breakfast.   Yes [provider]  furosemide (LASIX) 20 MG tablet Take 1 tablet (20 mg total) by mouth daily. Take extra dose if needed for leg swellings/shortness of breath or weight gain >5 pounds in 48 hrs 08/16/19  Yes Guilford Shi, MD  metoprolol tartrate (LOPRESSOR) 50 MG tablet Take 50 mg by mouth 2 (two) times daily.   Yes [provider]  MYRBETRIQ 25 MG TB24 tablet Take 25 mg by  mouth daily. 09/29/19  Yes [provider]  polyethylene glycol (MIRALAX / GLYCOLAX) 17 g packet Take 17 g by mouth daily. Patient taking differently: Take 17 g by mouth daily as needed for mild constipation.  08/17/19  Yes Guilford Shi, MD  senna-docusate (SENOKOT-S) 8.6-50 MG tablet Take 2 tablets by mouth 2 (two) times daily. Patient taking differently: Take 1-2 tablets by mouth at bedtime as needed for mild constipation.  09/21/19  Yes Orson Slick, MD  traMADol (ULTRAM) 50 MG tablet Take 50 mg by mouth every 12 (twelve) hours as needed for moderate pain.   Yes [provider]  Ear-Loop Mask Small MISC Place into the nose. 12/30/18   [provider]    Inpatient Medications: Scheduled Meds:  Continuous Infusions:  PRN Meds:   Allergies:    Allergies  Allergen Reactions  . Shellfish Allergy Itching  Social History:   Social History   Socioeconomic History  . Marital status: Single    Spouse name: Not on file  . Number of children: Not on file  . Years of education: Not on file  . Highest education level: Not on file  Occupational History  . Not on file  Tobacco Use  . Smoking status: Never Smoker  . Smokeless tobacco: Never Used  Substance and Sexual Activity  . Alcohol use: No  . Drug use: No  . Sexual activity: Not on file  Other Topics Concern  . Not on file  Social History Narrative  . Not on file   Social Determinants of Health   Financial Resource Strain:   . Difficulty of Paying Living Expenses:   Food Insecurity:   . Worried About Charity fundraiser in the Last Year:   . Arboriculturist in the Last Year:   Transportation Needs:   . Film/video editor (Medical):   Marland Kitchen Lack of Transportation (Non-Medical):   Physical Activity:   . Days of Exercise per Week:   . Minutes of Exercise per Session:   Stress:   . Feeling of Stress :   Social Connections:   . Frequency of Communication with Friends and Family:   .  Frequency of Social Gatherings with Friends and Family:   . Attends Religious Services:   . Active Member of Clubs or Organizations:   . Attends Archivist Meetings:   Marland Kitchen Marital Status:   Intimate Partner Violence:   . Fear of Current or Ex-Partner:   . Emotionally Abused:   Marland Kitchen Physically Abused:   . Sexually Abused:     Family History:   Family History  Problem Relation Age of Onset  . Hypertension Mother   . Hypertension Father      ROS:  Please see the history of present illness.   All other ROS reviewed and negative.     Physical Exam/Data:   Vitals:   10/06/19 1259 10/06/19 1500 10/06/19 1603  BP: (!) 146/73  (!) 149/73  Pulse: 66  66  Resp: 16  18  Temp: 98.2 F (36.8 C)    TempSrc: Oral    SpO2: 100%  96%  Weight:  78.9 kg    No intake or output data in the 24 hours ending 10/06/19 1637 Last 3 Weights 10/06/2019 09/21/2019 09/20/2019  Weight (lbs) 174 lb 174 lb 173 lb 1.9 oz  Weight (kg) 78.926 kg 78.926 kg 78.527 kg     Body mass index is 33.98 kg/m.  General:  Well nourished, well developed, very pleasant older AAF, in no acute distress.  HEENT: normal Lymph: no adenopathy Neck: no JVD Endocrine:  No thryomegaly Vascular: No carotid bruits; FA pulses 2+ bilaterally without bruits  Cardiac:  normal S1, S2; RRR; 4/6 systolic murmur  Lungs:  clear to auscultation bilaterally, no wheezing, rhonchi or rales  Abd: soft, nontender, no hepatomegaly  Ext: no edema Musculoskeletal:  No deformities, BUE and BLE strength normal and equal Skin: warm and dry  Neuro:  CNs 2-12 intact, no focal abnormalities noted Psych:  Normal affect   EKG:  The EKG was personally reviewed and demonstrates:  a-paced with TWI in inferolateral leads (unchanged from EKG noted back 1/21).  Relevant CV Studies:  TTE: 11/20 (care everywhere)  Findings  Mitral Valve  There is moderate thickening of the mitral valve leaflets.  There is anterior mitral valve prolapse.   There is  severe mitral regurgitation.  The regurgitant jet is directed posterolaterally .  Aortic Valve  Mild sclerosis of the trileaflet aortic valve, with adequate leaflet  motion.  There is trivial aortic regurgitation.   Tricuspid Valve  The tricuspid valve appears structurally normal.  There is mild tricuspid regurgitation.  Pulmonary artery systolic pressure estimated from the TR jet is 51 mmHg  consistent with moderate pulmonary hypertension.   Pulmonic Valve  The pulmonic valve is normal in structure and function.  Left Atrium  The left atrium is severely enlarged.  No thrombus was seen in the left atrial appendage.  Left Ventricle  There is mild concentric left ventricular hypertrophy.  The left ventricular function is normal.  The estimated left ventricular ejection fraction is 65%.  Right Atrium  The right atrium is normal in size.  A device lead is visualized in the right atrium.  Right Ventricle  Normal right ventricle size and function.  A device lead is seen in the right ventricle.  Pericardium  There is no evidence of pericardial effusion.  Interatrial Septum  The interatrial septum appears intact, without evidence of patent foramen  ovale.  Negative bubble study x 1.  Miscellaneous  The aortic root appears to be normal.  There are Grade 2 (extensive intimal thickening) changes of the descending  aorta.  Conclusions  Summary:    There is mild concentric left ventricular hypertrophy.  The left ventricular function is normal.  The estimated left ventricular ejection fraction is 65%.  The left atrium is severely enlarged.  No thrombus was seen in the left atrial appendage.  A device lead is visualized in the right atrium.  A device lead is seen in the right ventricle.  The interatrial septum appears intact, without evidence of patent foramen  ovale.  Negative bubble study x 1.  There is anterior mitral valve prolapse.  There is severe mitral  regurgitation.  The regurgitant jet is directed posterolaterally .  There is mild tricuspid regurgitation.  Pulmonary artery systolic pressure estimated from the TR jet is 51 mmHg  consistent with moderate pulmonary hypertension.  There were no complications  Compared to prior Echo 01/05/2018, there is progression in the severity of  MR.  Laboratory Data:  High Sensitivity Troponin:   Recent Labs  Lab 10/06/19 1308  TROPONINIHS 166*     Chemistry Recent Labs  Lab 10/06/19 1308  NA 140  K 3.8  CL 105  CO2 26  GLUCOSE 101*  BUN 20  CREATININE 1.70*  CALCIUM 9.0  GFRNONAA 29*  GFRAA 33*  ANIONGAP 9    No results for input(s): PROT, ALBUMIN, AST, ALT, ALKPHOS, BILITOT in the last 168 hours. Hematology Recent Labs  Lab 10/06/19 1308  WBC 5.6  RBC 4.62  HGB 12.9  HCT 40.6  MCV 87.9  MCH 27.9  MCHC 31.8  RDW 15.4  PLT 174   BNP Recent Labs  Lab 10/06/19 1526  BNP 329.2*    DDimer No results for input(s): DDIMER in the last 168 hours.   Radiology/Studies:  DG Chest 2 View  Result Date: 10/06/2019 CLINICAL DATA:  Chest pain, worse when lying down for the past 3 days. EXAM: CHEST - 2 VIEW COMPARISON:  07/26/2017. Chest, abdomen and pelvis CT dated 08/11/2019. FINDINGS: Stable enlarged cardiac silhouette and left subclavian pacemaker leads. Stable left posterior diaphragmatic eventration. Stable prominence of the interstitial markings without Kerley lines. No pleural fluid. Thoracic spine degenerative changes. IMPRESSION: No acute abnormality. Stable cardiomegaly and chronic  interstitial lung disease. Electronically Signed   By: Claudie Revering M.D.   On: 10/06/2019 13:35   HEAR Score (for undifferentiated chest pain):  HEAR Score: 5    Assessment and Plan:   Candice Lynn is a 79 y.o. female with a hx of severe MR, diastolic HF, CKD 3, DM, HTN, syncope, CHB s/p PPM, and recent diagnosis of squamous cell rectal Ca who is being seen today for the evaluation of  chest pain at the request of Dr. Darl Householder.  1. Chest pain: reports 2 separate episodes of chest pain over the past 2 days. These were left sided without radiation, shortness of breath or nausea. Noted while at rest. EKG does have TWI in inferolateral which have been noted in the past. Initial hsTn elevated at 166. Symptoms are somewhat atypical in nature, but unable to really assess exertional symptoms given her functional status. Complicated situation with her rectal Ca and hx of GI bleeding. Given she is pain free would favor a more conservative approach given no significant elevation in troponin or ischemic EKG changes. No signs of HF noted on exam, BNP is mildly elevated.  -- continue ASA, statin, BB, CCB. Will add nitrate therapy.  -- ok to discharge home, will arrange for outpatient follow up  2. Severe MR: she is followed through [facility] cardiology and assessed by the structural team there with no plans for Mitraclip given her recent Ca diagnosis.   3. CHB s/p PPM: last office note reports normal function. She denies any episodes of recent syncope  4. CKD III: baseline appears around 1.3-1.4. Elevated at 1.7 on admission. No ACEi/ARB given renal dysfunction.   5. Squamous cell rectal Ca: following with [place] and recently completed 5 round of palliative radiation. No recent GI bleeding reported. H/H stable  6. Diastolic HF: recent TEE [id number] with normal EF. No signs of HF noted on exam.   For questions or updates, please contact [place] Please consult [email or web address] for contact info under     Signed, Reino Bellis, NP  10/06/2019 4:37 PM   Personally seen and examined. Agree with above.   79 year old female with severe mitral regurgitation, squamous cell rectal cancer status post radiation palliation therapy localized, diabetes hypertension chronic kidney disease stage III with complete heart block status post pacemaker who came to the hospital today at her sister's  suggestion for atypical chest discomfort.  She said several different episodes of atypical chest discomfort over the past 2 days.  No shortness of breath no nausea.  Brief in nature.  Fleeting.  She told her sister about this this morning and therefore pain to the hospital.  Creatinine is 1.7.  Her high-sensitivity troponin was 166, this prompted further cardiology evaluation.  EKG personally reviewed shows atrial pacing with T wave inversions in the inferolateral leads which are old.  Chest x-ray looks good without edema.  GEN: Well nourished, well developed, in no acute distress, elderly HEENT: normal  Neck: no JVD, carotid bruits, or masses Cardiac: RRR; 3/6 holosystolic apical murmur, no rubs, or gallops,no edema  Respiratory:  clear to auscultation bilaterally, normal work of breathing GI: soft, nontender, nondistended, + BS MS: no deformity or atrophy  Skin: warm and dry, no rash Neuro:  Alert and Oriented x 3, Strength and sensation are intact Psych: euthymic mood, full affect  Assessment and plan:  Atypical chest discomfort with mildly elevated high-sensitivity troponin -Given her underlying chronic kidney disease, severe mitral regurgitation, I would expect that her  high-sensitivity troponin elevation could be mildly elevated at baseline.  Currently she is pain-free very comfortable in bed.  No shortness of breath no orthopnea.  Her main complaint at this point is "I need to pee ".  Thankfully, she had her sister on cell phone with her and we were able to discuss her current history. -I think the addition of isosorbide 30 mg in addition to her current medications which include calcium channel blockers make sense to help with possible anginal symptoms.  Her chest discomfort is quite atypical. -Given her overall comorbidities, I do not believe that she is a candidate for invasive therapies such as cardiac catheterization.  At [facility], she was not felt to be a MitraClip candidate.   She does not show any signs of overt heart failure currently.  Chest x-ray unremarkable.  Lungs sound clear. -I think it is reasonable for her to be comfortable at home and allowed to be discharged from the ER with continued cardiology follow-up.  Of course, given her mildly elevated troponin, she is at higher morbidity mortality risk.  Squamous cell cancer of the rectum -She is undergone 5 rounds of palliative radiation.  Complete. -She is not very active at home, her sister helps her with most activities.  Severe mitral regurgitation -This is been worked up extensively at [facility], she was considered for MitraClip procedure but did not qualify given her overall comorbidities.   Once again, I am comfortable with her discharge from the emergency department.  I think overall her goals of care here are to continue with maintenance of quality of life, palliation.  She would not be a candidate for any invasive therapies.  I think that the addition of isosorbide could potentially help with symptoms if anginal related.  Of course, if headache occurs, this may need to be discontinued.  I discussed her care with ER physician.  Candee Furbish, MD

## 2019-10-06 NOTE — ED Provider Notes (Signed)
[facility] EMERGENCY DEPARTMENT Provider Note   CSN: [id number] Arrival date & time: 10/06/19  1251     History Chief Complaint  Patient presents with  . Chest Pain    Candice Lynn is a 79 y.o. female history of CAD, CHF, CKD, hypertension, lower GI bleed from rectal cancer status post radiation here presenting with chest pain .  Patient has been having chest pain when she lays down for the last several days. Denies any leg swelling or weight gain .  Patient denies any shortness of breath.  Patient states that she does not walk around much at baseline due to her cancer.  Of note, patient was recently admitted for GI bleed secondary to her rectal cancer and required transfusion.  Patient did take 1 aspirin today.  Of note, patient also did see Dr. Tamala Julian from cardiology several weeks ago and was thought to have severe mitral regurgitation.  The history is provided by the patient.       Past Medical History:  Diagnosis Date  . Apnea   . Benign neoplasm of ascending colon   . Benign neoplasm of sigmoid colon   . Benign neoplasm of transverse colon   . CAD (coronary artery disease)    non-obstructive  . CHF (congestive heart failure) ([place])   . CKD (chronic kidney disease), stage III   . Colon polyps 08/16/2019  . Diabetes mellitus without complication ([place])   . Diverticulosis 08/16/2019  . GI bleed 07/2019   LOWER GI  . Hematochezia 08/10/2019  . Hematuria 08/10/2019  . HTN (hypertension) 08/10/2019  . Hypercholesterolemia   . Hypertension   . Lower GI bleed 08/10/2019  . PAT (paroxysmal atrial tachycardia) ([place])   . Rectal mass   . 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 Type 2 diabetes mellitus ([place]) 08/10/2019    Patient Active Problem List   Diagnosis Date Noted  . 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 Colon polyps 08/16/2019  . Diverticulosis 08/16/2019  . Rectal mass   . Benign neoplasm of ascending colon   . Benign  neoplasm of transverse colon   . Benign neoplasm of sigmoid colon   . Lower GI bleed 08/10/2019  . CKD (chronic kidney disease), stage III 08/10/2019  . CHF (congestive heart failure) ([place]) 08/10/2019  . Type 2 diabetes mellitus ([place]) 08/10/2019  . HTN (hypertension) 08/10/2019  . Hematochezia 08/10/2019  . Hematuria 08/10/2019  . PAT (paroxysmal atrial tachycardia) ([place])   . CAD (coronary artery disease)     Past Surgical History:  Procedure Laterality Date  . ABDOMINAL HYSTERECTOMY    . BIOPSY  08/11/2019   Procedure: BIOPSY;  Surgeon: Jerene Bears, MD;  Location: [facility] ENDOSCOPY;  Service: Gastroenterology;;  . COLONOSCOPY  08/11/2019  . COLONOSCOPY WITH PROPOFOL N/A 08/11/2019   Procedure: COLONOSCOPY WITH PROPOFOL;  Surgeon: Jerene Bears, MD;  Location: [facility];  Service: Gastroenterology;  Laterality: N/A;  . PACEMAKER IMPLANT    . POLYPECTOMY  08/11/2019   Procedure: POLYPECTOMY;  Surgeon: Jerene Bears, MD;  Location[facility] ENDOSCOPY;  Service: Gastroenterology;;     OB History   No obstetric history on file.     No family history on file.  Social History   Tobacco Use  . Smoking status: Never Smoker  . Smokeless tobacco: Never Used  Substance Use Topics  . Alcohol use: No  . Drug use: No    Home Medications Prior to Admission  medications   Medication Sig Start Date End Date Taking? Authorizing Provider  acetaminophen (TYLENOL) 500 MG tablet Take 500 mg by mouth every 6 (six) hours as needed for mild pain.   Yes [provider]  allopurinol (ZYLOPRIM) 300 MG tablet Take 300 mg by mouth daily.   Yes [provider]  aspirin EC 81 MG tablet Take 81 mg by mouth daily.   Yes [provider]  atorvastatin (LIPITOR) 20 MG tablet Take 20 mg by mouth at bedtime.   Yes [provider]  CARTIA XT 180 MG 24 hr capsule Take 180 mg by mouth daily. 08/08/19  Yes [provider]  ferrous sulfate 325 (65 FE) MG tablet Take 325 mg by  mouth daily with breakfast.   Yes [provider]  furosemide (LASIX) 20 MG tablet Take 1 tablet (20 mg total) by mouth daily. Take extra dose if needed for leg swellings/shortness of breath or weight gain >5 pounds in 48 hrs 08/16/19  Yes Guilford Shi, MD  metoprolol tartrate (LOPRESSOR) 50 MG tablet Take 50 mg by mouth 2 (two) times daily.   Yes [provider]  MYRBETRIQ 25 MG TB24 tablet Take 25 mg by mouth daily. 09/29/19  Yes [provider]  polyethylene glycol (MIRALAX / GLYCOLAX) 17 g packet Take 17 g by mouth daily. Patient taking differently: Take 17 g by mouth daily as needed for mild constipation.  08/17/19  Yes Guilford Shi, MD  senna-docusate (SENOKOT-S) 8.6-50 MG tablet Take 2 tablets by mouth 2 (two) times daily. Patient taking differently: Take 1-2 tablets by mouth at bedtime as needed for mild constipation.  09/21/19  Yes Orson Slick, MD  traMADol (ULTRAM) 50 MG tablet Take 50 mg by mouth every 12 (twelve) hours as needed for moderate pain.   Yes [provider]  Ear-Loop Mask Small MISC Place into the nose. 12/30/18   [provider]    Allergies    Shellfish allergy  Review of Systems   Review of Systems  Cardiovascular: Positive for chest pain.  All other systems reviewed and are negative.   Physical Exam Updated Vital Signs BP (!) 146/73 (BP Location: Right Arm)   Pulse 66   Temp 98.2 F (36.8 C) (Oral)   Resp 16   Wt 78.9 kg Comment: Per chart <1 month ago  SpO2 100%   BMI 33.98 kg/m   Physical Exam Vitals and nursing note reviewed.  HENT:     Head: Normocephalic.  Eyes:     Extraocular Movements: Extraocular movements intact.     Pupils: Pupils are equal, round, and reactive to light.  Cardiovascular:     Rate and Rhythm: Normal rate and regular rhythm.     Heart sounds: Normal heart sounds.  Pulmonary:     Effort: Pulmonary effort is normal.     Comments: Diminished bilateral bases    Abdominal:     General: Bowel sounds are normal.     Palpations: Abdomen is soft.  Musculoskeletal:        General: Normal range of motion.     Cervical back: Normal range of motion.  Skin:    General: Skin is warm.     Capillary Refill: Capillary refill takes less than 2 seconds.  Neurological:     General: No focal deficit present.     Mental Status: She is alert.  Psychiatric:        Mood and Affect: Mood normal.  Behavior: Behavior normal.     ED Results / Procedures / Treatments   Labs (all labs ordered are listed, but only abnormal results are displayed) Labs Reviewed  BASIC METABOLIC PANEL - Abnormal; Notable for the following components:      Result Value   Glucose, Bld 101 (*)    Creatinine, Ser 1.70 (*)    GFR calc non Af Amer 29 (*)    GFR calc Af Amer 33 (*)    All other components within normal limits  TROPONIN I (HIGH SENSITIVITY) - Abnormal; Notable for the following components:   Troponin I (High Sensitivity) 166 (*)    All other components within normal limits  CBC  BRAIN NATRIURETIC PEPTIDE  TROPONIN I (HIGH SENSITIVITY)    EKG EKG Interpretation  Date/Time:  Thursday October 06 2019 12:53:04 EST Ventricular Rate:  78 PR Interval:  294 QRS Duration: 78 QT Interval:  368 QTC Calculation: 419 R Axis:   38 Text Interpretation: Atrial-paced rhythm with prolonged AV conduction T wave abnormality, consider inferolateral ischemia Abnormal ECG No significant change since last tracing Confirmed by Wandra Arthurs [phone]) on 10/06/2019 3:10:45 PM   Radiology DG Chest 2 View  Result Date: 10/06/2019 CLINICAL DATA:  Chest pain, worse when lying down for the past 3 days. EXAM: CHEST - 2 VIEW COMPARISON:  07/26/2017. Chest, abdomen and pelvis CT dated 08/11/2019. FINDINGS: Stable enlarged cardiac silhouette and left subclavian pacemaker leads. Stable left posterior diaphragmatic eventration. Stable prominence of the interstitial markings without Kerley  lines. No pleural fluid. Thoracic spine degenerative changes. IMPRESSION: No acute abnormality. Stable cardiomegaly and chronic interstitial lung disease. Electronically Signed   By: Claudie Revering M.D.   On: 10/06/2019 13:35    Procedures Procedures (including critical care time)    EMERGENCY DEPARTMENT [place] CARDIAC EXAM "Study: Limited Ultrasound of the Heart and Pericardium"  INDICATIONS:Chest pain Multiple views of the heart and pericardium were obtained in real-time with a multi-frequency probe.  PERFORMED [id number] IMAGES ARCHIVED?: Yes LIMITATIONS:  Body habitus VIEWS USED: Subcostal 4 chamber, Parasternal long axis and Parasternal short axis INTERPRETATION: Cardiac activity present, poor EF, dilated LA , no pericardial effusion.   CRITICAL CARE Performed by: Wandra Arthurs   Total critical care time: 30 minutes  Critical care time was exclusive of separately billable procedures and treating other patients.  Critical care was necessary to treat or prevent imminent or life-threatening deterioration.  Critical care was time spent personally by me on the following activities: development of treatment plan with patient and/or surrogate as well as nursing, discussions with consultants, evaluation of patient's response to treatment, examination of patient, obtaining history from patient or surrogate, ordering and performing treatments and interventions, ordering and review of laboratory studies, ordering and review of radiographic studies, pulse oximetry and re-evaluation of patient's condition.   Medications Ordered in ED Medications  sodium chloride flush (NS) 0.9 % injection 3 mL (has no administration in time range)  aspirin chewable tablet 243 mg (has no administration in time range)    ED Course  I have reviewed the triage vital signs and the nursing notes.  Pertinent labs & imaging results that were available during my care of the patient were reviewed by me and considered in  my medical decision making (see chart for details).    MDM Rules/Calculators/A&P                      Candice Lynn is a 79 y.o.  female here with chest pain worse with laying down. Has hx of mitral regurgitation. Bedside Echo showed no effusion, just poor EF and enlarged LA. Patient does have mild AKI. She also has trop 166 in triage. Given ASA in the ED. Since she had recent lower GI bleed requiring transfusion, will hold off heparin until cardiology sees patient. I think likely demand ischemia from mitral regurg.    4:21 PM Cardiology consult pending. Anticipate admission either by cardiology or hospitalist. Signed out to Dr. Francia Greaves in the ED.   Final Clinical Impression(s) / ED Diagnoses Final diagnoses:  None    Rx / DC Orders ED Discharge Orders    None       Drenda Freeze, MD 10/06/19 1622

## 2019-10-06 NOTE — Discharge Instructions (Addendum)
Please return for any problem.  Follow-up with your cardiologist as an outpatient as instructed.  Take Imdur as prescribed.

## 2019-10-06 NOTE — ED Triage Notes (Signed)
Pt here for L sided chest pain only when lying down x a few days. Denies shob, n/v, dizziness. Denies chest pain at present. Currently receiving radiation tx for rectal cancer, last tx yesterday.

## 2019-10-06 NOTE — ED Provider Notes (Addendum)
Patient seen after prior ED provider.  Patient has been evaluated by cardiology.  She does not appear to be appropriate for admission per cardiology (Dr. Marlou Porch).    Patient does desire discharge.  Patient will be discharged home with a prescription for Imdur which cardiology has instructed her to take.  Importance of close follow-up is stressed.  Strict return precautions given and understood.   Valarie Merino, MD 10/06/19 1756    Valarie Merino, MD 10/06/19 1806

## 2019-10-12 ENCOUNTER — Ambulatory Visit: Payer: Medicare Other | Attending: Internal Medicine

## 2019-10-12 DIAGNOSIS — Z23 Encounter for immunization: Secondary | ICD-10-CM

## 2019-10-12 NOTE — Progress Notes (Signed)
   Covid-19 Vaccination Clinic  Name:  Candice Lynn    MRN: [id number] DOB: 29-Oct-1940  10/12/2019  Candice Lynn was observed post Covid-19 immunization for 15 minutes without incident. She was provided with Vaccine Information Sheet and instruction to access the V-Safe system.   Candice Lynn was instructed to call 911 with any severe reactions post vaccine: Marland Kitchen Difficulty breathing  . Swelling of face and throat  . A fast heartbeat  . A bad rash all over body  . Dizziness and weakness   Immunizations Administered    Name Date Dose VIS Date Route   Pfizer COVID-19 Vaccine 10/12/2019 12:07 PM 0.3 mL 07/08/2019 Intramuscular   Manufacturer: [place]   Lot: [id number]   [place]: [id number]

## 2019-10-18 ENCOUNTER — Other Ambulatory Visit: Payer: Self-pay | Admitting: Hematology and Oncology

## 2019-10-18 DIAGNOSIS — C210 Malignant neoplasm of anus, unspecified: Secondary | ICD-10-CM

## 2019-10-18 DIAGNOSIS — D500 Iron deficiency anemia secondary to blood loss (chronic): Secondary | ICD-10-CM

## 2019-10-19 ENCOUNTER — Inpatient Hospital Stay: Payer: Medicare Other | Admitting: Hematology and Oncology

## 2019-10-19 ENCOUNTER — Inpatient Hospital Stay: Payer: Medicare Other

## 2019-10-19 ENCOUNTER — Telehealth: Payer: Self-pay | Admitting: *Deleted

## 2019-10-19 NOTE — Telephone Encounter (Signed)
TCT patient to confirm that patient has moved back to the [place] with patient and she has moved back home and is seeing an Materials engineer in [place]. She states she is doing ok.  Will cancel her appts here.  Dr. Lorenso Courier is aware.

## 2019-10-26 ENCOUNTER — Telehealth: Payer: Self-pay | Admitting: Radiation Oncology

## 2019-10-26 NOTE — Telephone Encounter (Signed)
  Radiation Oncology         [phone]) [phone] ________________________________  Name: Candice Lynn MRN: [id number]  Date of Service: 10/26/2019  DOB: 09-22-1940  Post Treatment Telephone Note  Diagnosis:   Squamous Cell Carcinoma of the Anus  Interval Since Last Radiation: 3 weeks   09/29/19-10/05/19: The primary lesion in the anus was treated to 25 Gy in 5 fractions.  Narrative:  The patient was contacted today for routine follow-up. During treatment she did very well with radiotherapy and did not have significant desquamation.   Impression/Plan: 1. Squamous Cell Carcinoma of the Anus. The patient has been doing well since completion of radiotherapy. I left a message for her sister and discussed that we would be happy to continue to follow her as needed, but she will also continue to follow up with medical oncology in [place], [place].    Carola Rhine, PAC

## 2019-12-08 NOTE — Progress Notes (Signed)
  Radiation Oncology         [phone] ________________________________  Name: Candice Lynn MRN: [id number]  Date: 10/05/2019  DOB: 30-Jun-1941  End of Treatment Note  Diagnosis:   Anal cancer     Indication for treatment::  palliative       Radiation treatment dates:   09/29/2019 through 10/05/2019  Site/dose:   The anal tumor was treated to a dose of 25 Gray in 5 fractions at [employer] per fraction.  This was accomplished using a 4 field IMRT technique.  Narrative: The patient tolerated radiation treatment relatively well.     Plan: The patient has completed radiation treatment. The patient will return to radiation oncology clinic for routine followup in one month. I advised the patient to call or return sooner if they have any questions or concerns related to their recovery or treatment. ________________________________  Jodelle Gross, M.D., Ph.D.

## 2020-11-01 IMAGING — CT CT ABD-PELV W/ CM
2 of 5 series · 12 of 36 positions shown, 15 images · IV contrast (Omni 300)
Comparison: None.

CLINICAL DATA: Rectal mass on colonoscopy today.

EXAM:
CT CHEST, ABDOMEN, AND PELVIS WITH CONTRAST
TECHNIQUE: Multidetector CT imaging of the chest, abdomen and pelvis was
performed following the standard protocol during bolus
administration of intravenous contrast.
CONTRAST:  80mL OMNIPAQUE IOHEXOL 300 MG/ML  SOLN

[Series 4: cap with 5mm st · axial · 0.98mm/px · z∈[+896,+1421]mm · 9 of 129 slices shown, 12 images]
[im 12/129  mediastinal]
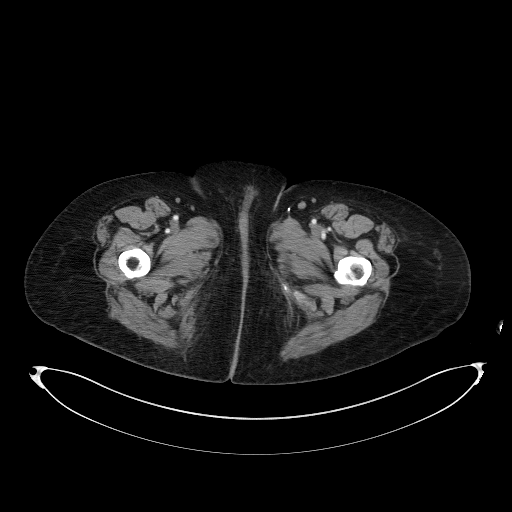
[im 12/129  lung]
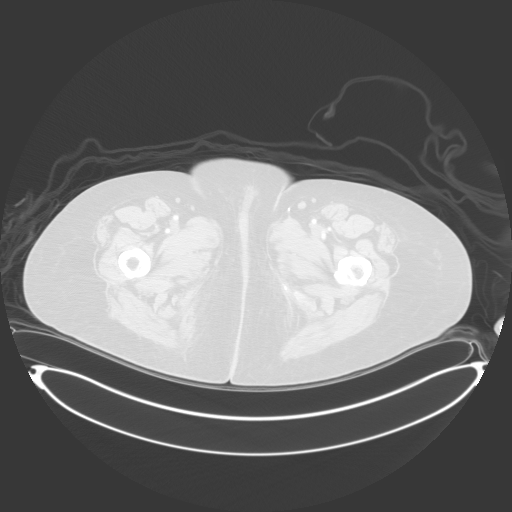
[im 24/129  lung]
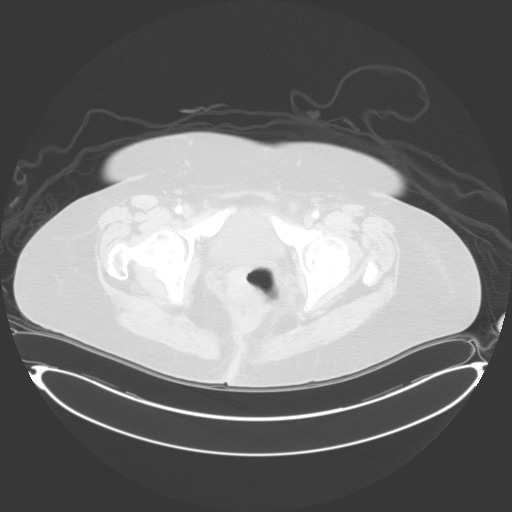
[im 35/129  lung]
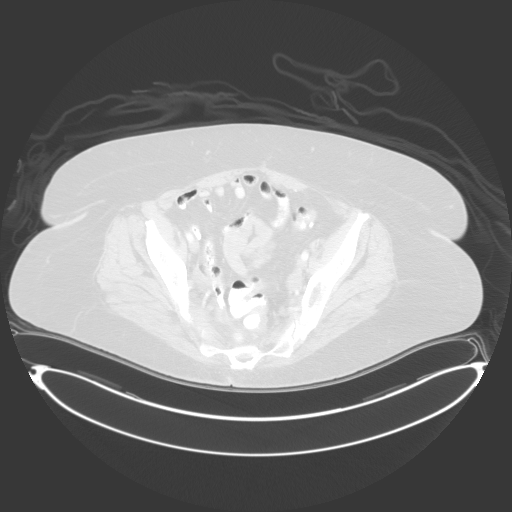
[im 47/129  lung]
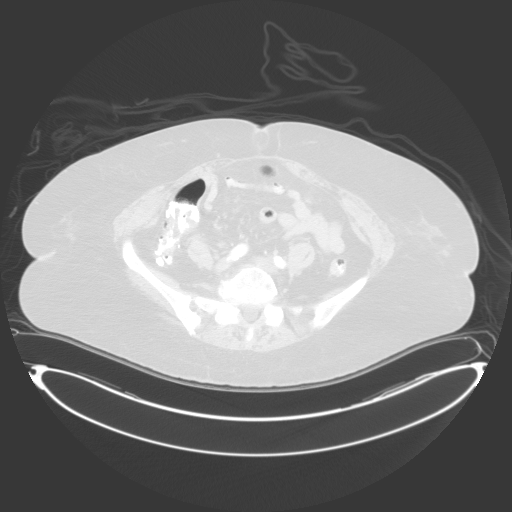
[im 70/129  mediastinal]
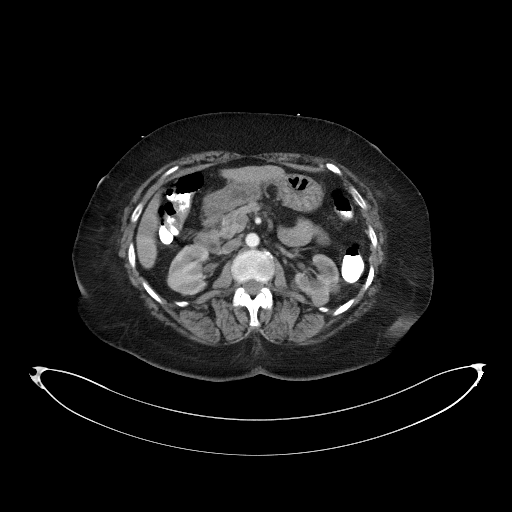
[im 70/129  lung]
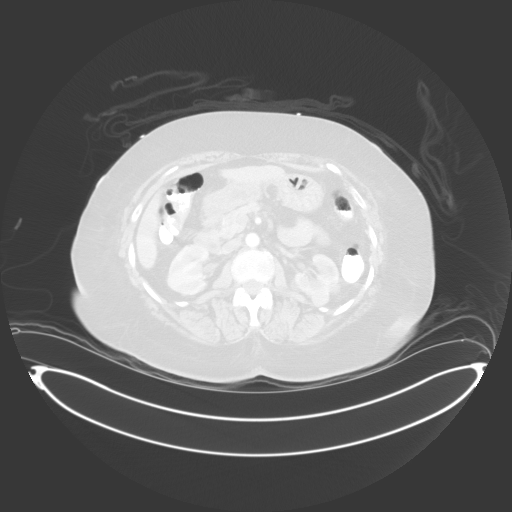
[im 82/129  lung]
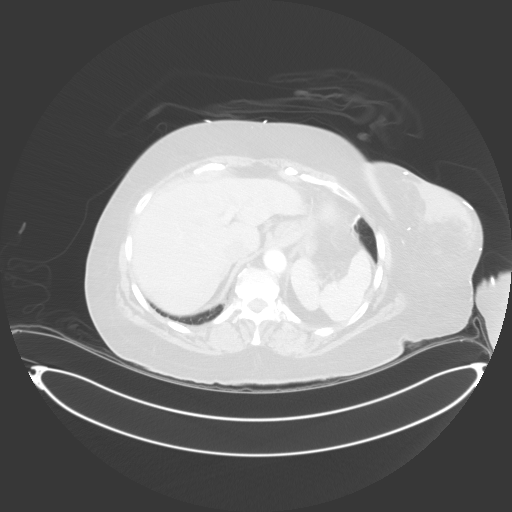
[im 94/129  lung]
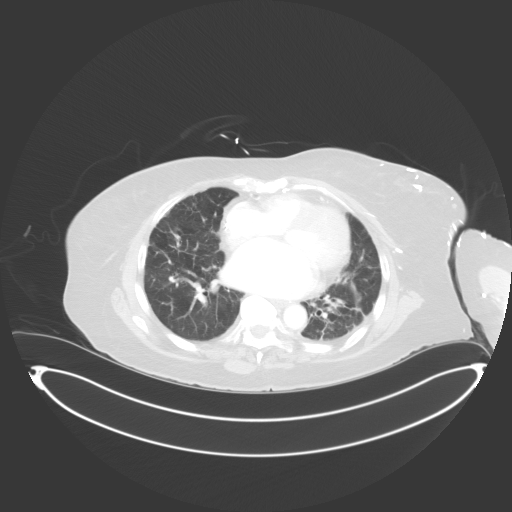
[im 105/129  lung]
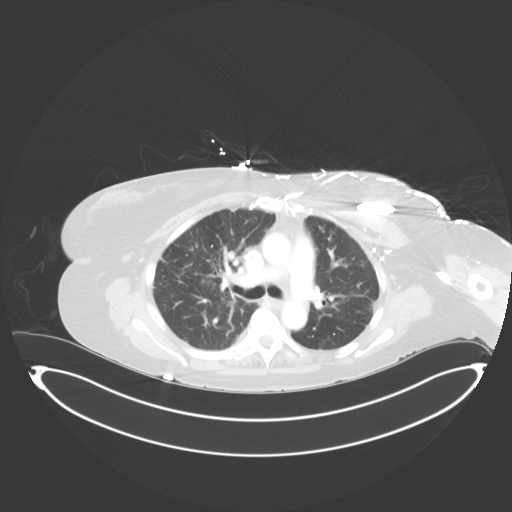
[im 117/129  mediastinal]
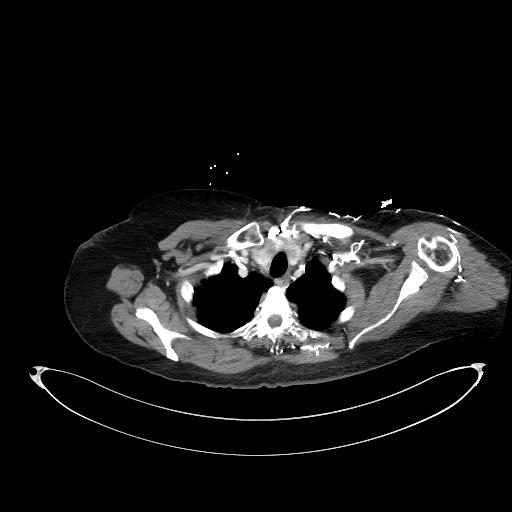
[im 117/129  lung]
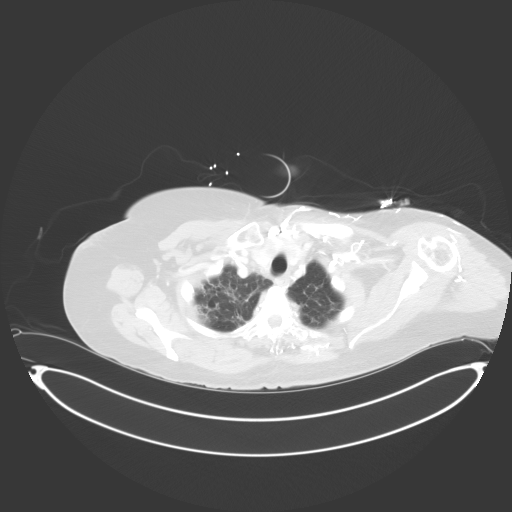

[Series 6: cap with 3mm st cor · coronal · 0.88mm/px · 3 of 157 slices shown]
[im 32/157  lung]
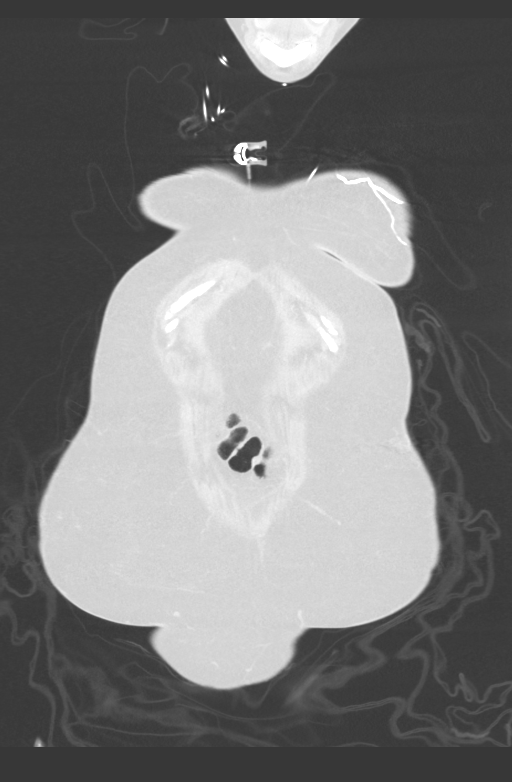
[im 63/157  lung]
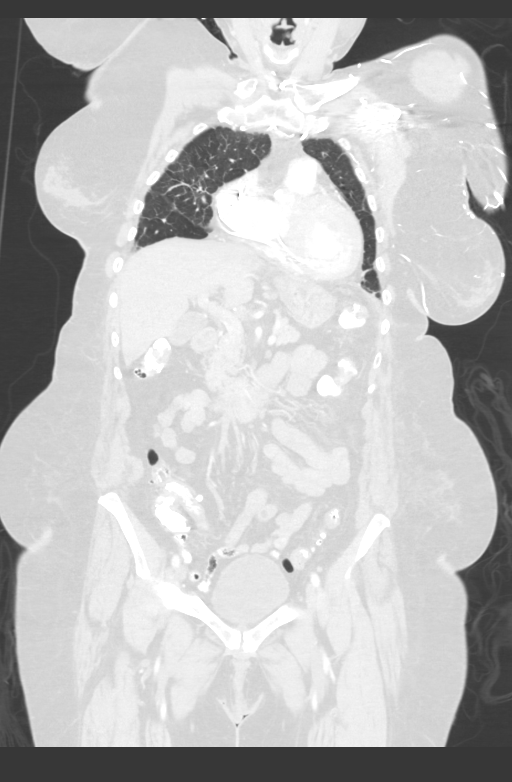
[im 94/157  lung]
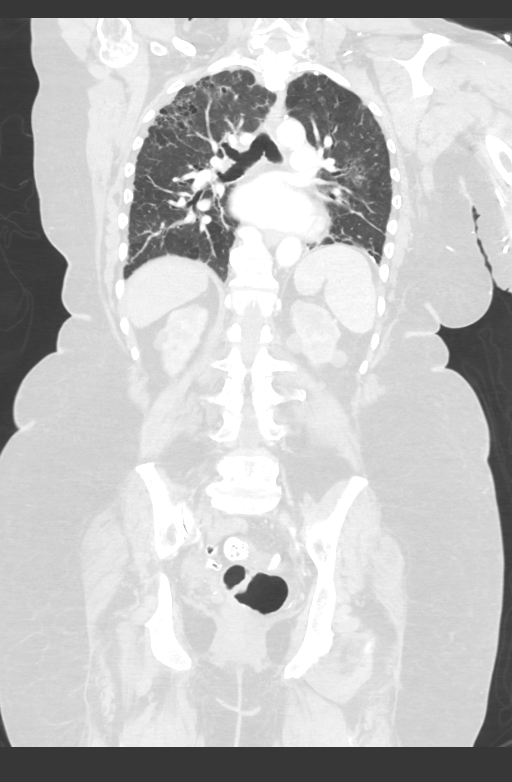

[12 of 36 positions shown; findings below may reference images not displayed]

FINDINGS: CT CHEST FINDINGS

Cardiovascular: The heart is within normal limits in size for age.
There is moderate left atrial enlargement noted. No pericardial
effusion. Scattered aortic calcifications and coronary artery
calcifications are noted. The pulmonary arteries appear grossly
normal.

Extensive left-sided chest wall collateral vessels are noted. I
suspect there may be some stenosis of the left brachiocephalic vein
due to the pacer wires.

Mediastinum/Nodes: No mediastinal or hilar mass or adenopathy. The
esophagus is grossly normal.

Lungs/Pleura: Emphysematous changes and fairly significant pulmonary
scarring along with areas of bronchiectasis. No superimposed
infiltrates or effusions. No worrisome pulmonary lesions. No
pulmonary nodules to suggest pulmonary metastatic disease. No
pleural effusions.

Chest wall/musculoskeletal: No breast masses, supraclavicular or
axillary adenopathy.

Enlarged thyroid gland with multiple thyroid nodules. None of these
measures larger than 8 mm. This is likely multinodular goiter.

CT ABDOMEN PELVIS FINDINGS

Hepatobiliary: No focal hepatic lesions to suggest hepatic
metastatic disease. No intra or extrahepatic biliary dilatation. The
gallbladder appears normal.

Pancreas: No mass, inflammation or ductal dilatation.

Spleen: Normal in size. No focal lesions. Numerous calcified
granulomas.

Adrenals/Urinary Tract: The adrenal glands are unremarkable.

Bilateral renal cysts are noted.

17 mm indeterminate lesion projecting off the midpole region of the
left kidney posteriorly. This demonstrates high attenuation (68
Hounsfield units). It could be an enhancing solid renal lesion or a
hemorrhagic cyst. Slightly more inferior and lateral to this lesion
is a 15 mm lesion which measures 37 Hounsfield units and also could
be a hemorrhagic cyst.

16 mm hyperdense lesion projecting off the medial cortex of the left
kidney in the midpole region on image 58/3 is also indeterminate.

Hyperdense lesion projecting off the midpole region of the right
kidney laterally measures 73 Hounsfield units and is indeterminate.

The bladder is unremarkable.

Stomach/Bowel: The stomach, duodenum and small bowel are
unremarkable. No acute inflammatory changes, mass lesions or
obstructive findings.

Diffuse colonic diverticulosis without findings for acute
diverticulitis. Ill-defined right-sided rectal wall thickening
consistent with patient's known rectal cancer. No obvious perirectal
involvement. No perirectal adenopathy or pelvic sidewall adenopathy
the. No enlarged nodes in the sigmoid mesocolon.

Vascular/Lymphatic: Advanced atherosclerotic calcifications
involving the aorta iliac arteries but no aneurysm or dissection.
The branch vessels are patent. The major venous structures are
patent. No mesenteric or retroperitoneal lymphadenopathy.

Small medial and lateral external iliac artery lymph nodes are
noted.

7 mm lymph node on image 99/3.

5 mm left-sided node on image 99/3.

5 mm right lateral external iliac node on image 98/3.

11 mm right common femoral node on image 104/3.

Left lateral external iliac node on image 100/3.

13 mm right inguinal lymph node on image number 111/3.

Reproductive: The uterus is surgically absent. Both ovaries are
still present and appear normal.

Other: No free pelvic fluid collections. Small periumbilical
abdominal wall hernia containing fat.

Musculoskeletal: No significant bony findings.
IMPRESSION: 1. Ill-defined right-sided rectal mass correlating with the
patient's known rectal cancer. No perirectal adenopathy or sigmoid
mesocolon adenopathy.
2. Small/borderline iliac lymph nodes bilaterally as detailed above.
There is also a 13 mm right inguinal lymph node.
3. No findings for hepatic metastatic disease and no retroperitoneal
adenopathy.
4. No CT findings for pulmonary metastatic disease.
5. Four indeterminate renal lesions, most likely hyperdense cysts
but will require further evaluation. Recommend MRI abdomen without
and with contrast when able.
6. Colonic diverticulosis but no other colonic lesions are
identified.
7. Multinodular thyroid goiter.

## 2020-11-01 IMAGING — CT CT CHEST W/ CM
2 of 4 series · 12 of 36 positions shown, 15 images · IV contrast (Omni 300)
Comparison: None.

CLINICAL DATA: Rectal mass on colonoscopy today.

EXAM:
CT CHEST, ABDOMEN, AND PELVIS WITH CONTRAST
TECHNIQUE: Multidetector CT imaging of the chest, abdomen and pelvis was
performed following the standard protocol during bolus
administration of intravenous contrast.
CONTRAST:  80mL OMNIPAQUE IOHEXOL 300 MG/ML  SOLN

[Series 3: cap with 5mm st · axial · 0.98mm/px · z∈[+886,+1431]mm · 9 of 129 slices shown, 12 images]
[im 10/129  mediastinal]
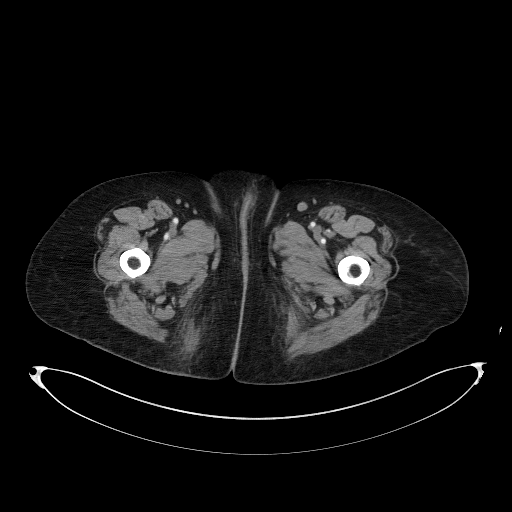
[im 10/129  lung]
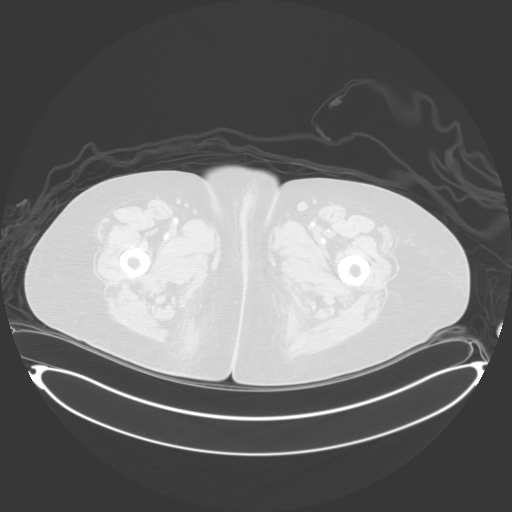
[im 30/129  lung]
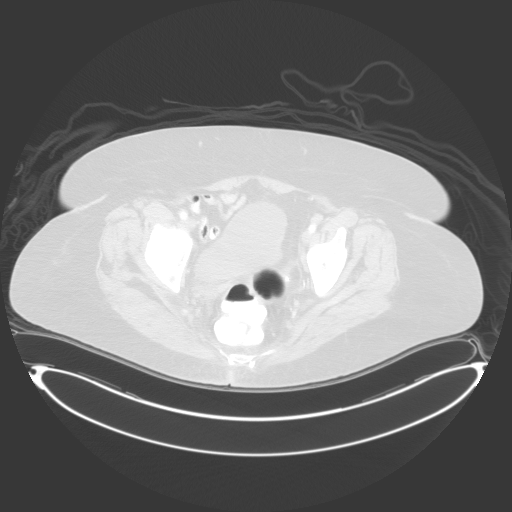
[im 40/129  lung]
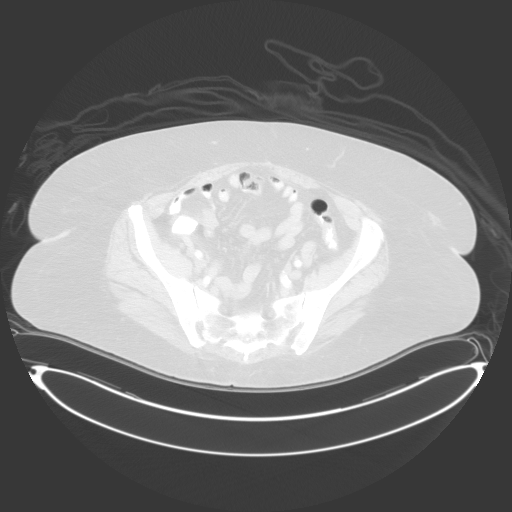
[im 50/129  lung]
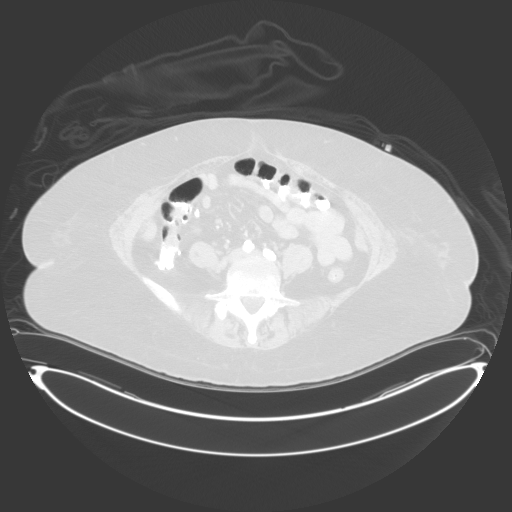
[im 69/129  mediastinal]
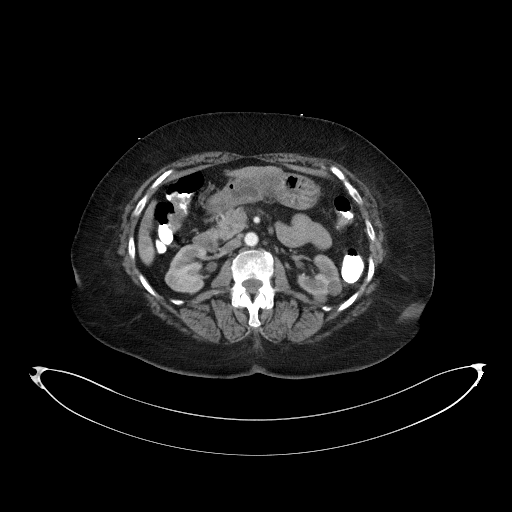
[im 69/129  lung]
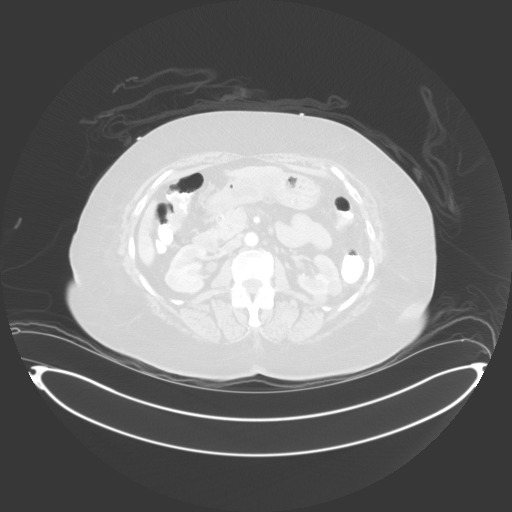
[im 79/129  lung]
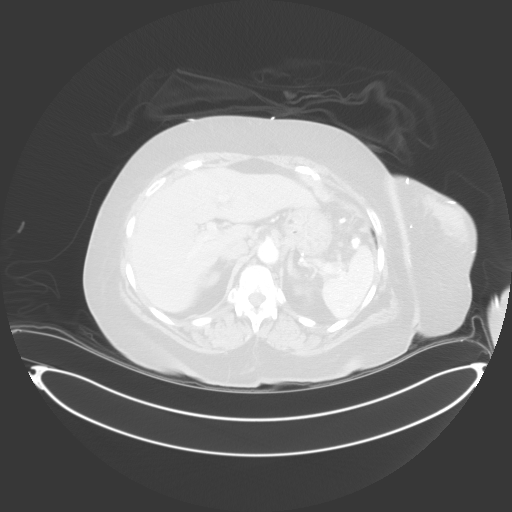
[im 89/129  lung]
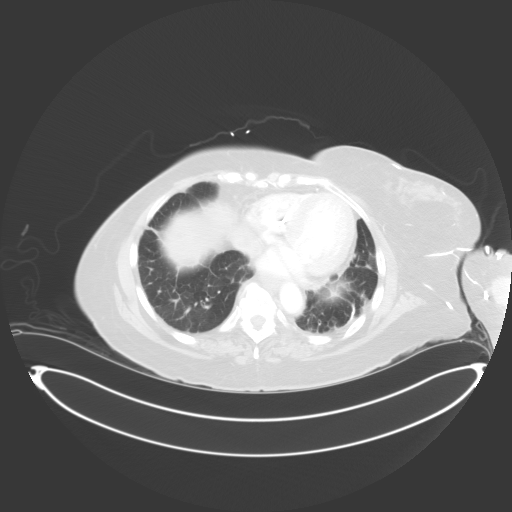
[im 109/129  lung]
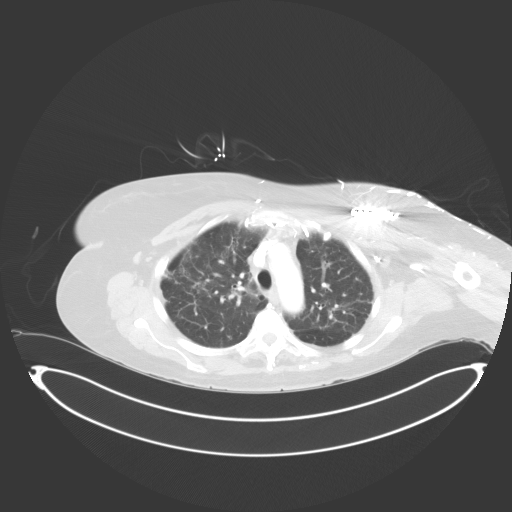
[im 119/129  mediastinal]
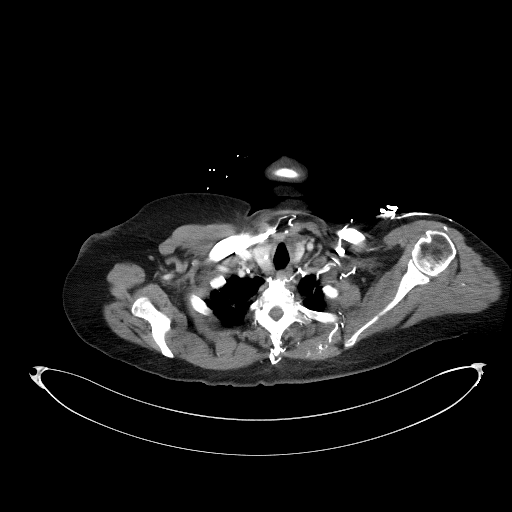
[im 119/129  lung]
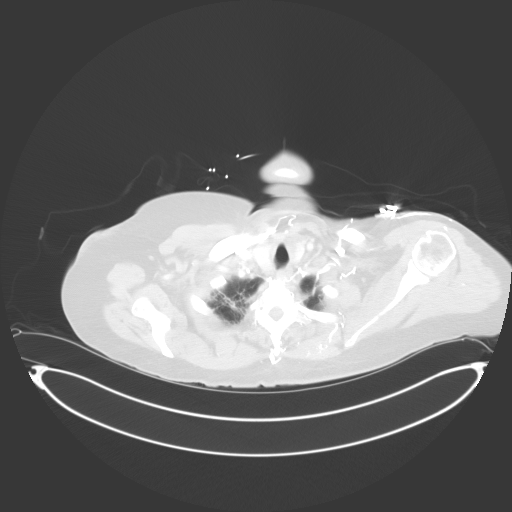

[Series 6: cap with 3mm st cor · coronal · 0.88mm/px · 3 of 157 slices shown]
[im 32/157  lung]
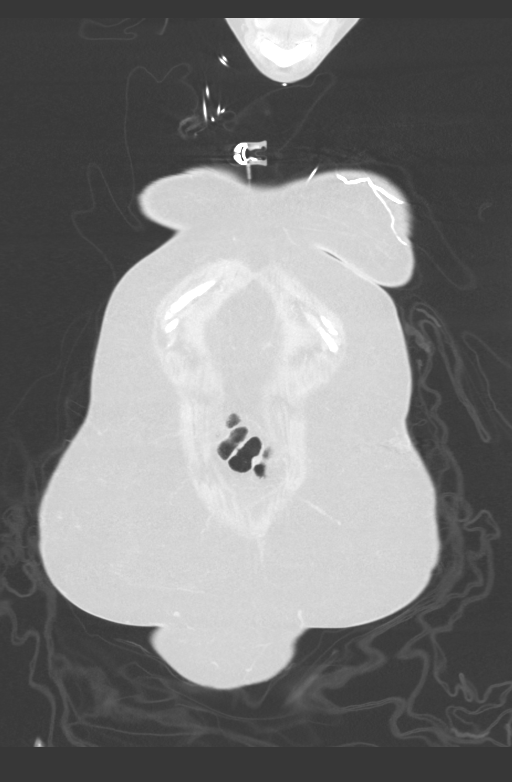
[im 63/157  lung]
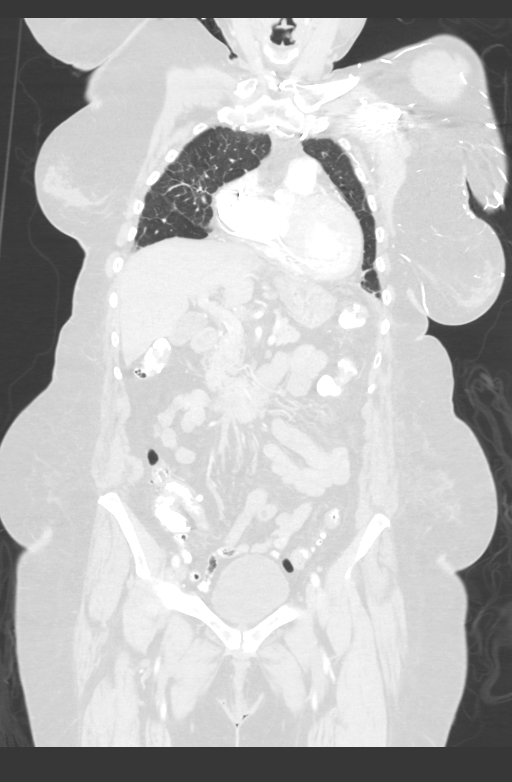
[im 94/157  lung]
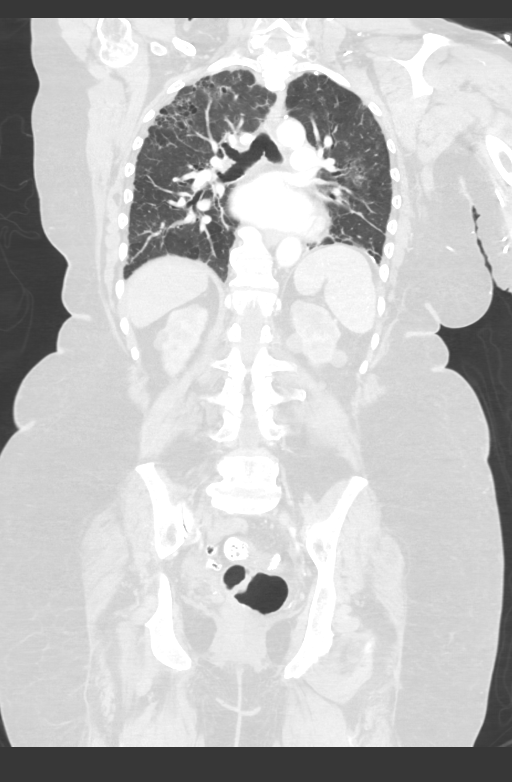

[12 of 36 positions shown; findings below may reference images not displayed]

FINDINGS: CT CHEST FINDINGS

Cardiovascular: The heart is within normal limits in size for age.
There is moderate left atrial enlargement noted. No pericardial
effusion. Scattered aortic calcifications and coronary artery
calcifications are noted. The pulmonary arteries appear grossly
normal.

Extensive left-sided chest wall collateral vessels are noted. I
suspect there may be some stenosis of the left brachiocephalic vein
due to the pacer wires.

Mediastinum/Nodes: No mediastinal or hilar mass or adenopathy. The
esophagus is grossly normal.

Lungs/Pleura: Emphysematous changes and fairly significant pulmonary
scarring along with areas of bronchiectasis. No superimposed
infiltrates or effusions. No worrisome pulmonary lesions. No
pulmonary nodules to suggest pulmonary metastatic disease. No
pleural effusions.

Chest wall/musculoskeletal: No breast masses, supraclavicular or
axillary adenopathy.

Enlarged thyroid gland with multiple thyroid nodules. None of these
measures larger than 8 mm. This is likely multinodular goiter.

CT ABDOMEN PELVIS FINDINGS

Hepatobiliary: No focal hepatic lesions to suggest hepatic
metastatic disease. No intra or extrahepatic biliary dilatation. The
gallbladder appears normal.

Pancreas: No mass, inflammation or ductal dilatation.

Spleen: Normal in size. No focal lesions. Numerous calcified
granulomas.

Adrenals/Urinary Tract: The adrenal glands are unremarkable.

Bilateral renal cysts are noted.

17 mm indeterminate lesion projecting off the midpole region of the
left kidney posteriorly. This demonstrates high attenuation (68
Hounsfield units). It could be an enhancing solid renal lesion or a
hemorrhagic cyst. Slightly more inferior and lateral to this lesion
is a 15 mm lesion which measures 37 Hounsfield units and also could
be a hemorrhagic cyst.

16 mm hyperdense lesion projecting off the medial cortex of the left
kidney in the midpole region on image 58/3 is also indeterminate.

Hyperdense lesion projecting off the midpole region of the right
kidney laterally measures 73 Hounsfield units and is indeterminate.

The bladder is unremarkable.

Stomach/Bowel: The stomach, duodenum and small bowel are
unremarkable. No acute inflammatory changes, mass lesions or
obstructive findings.

Diffuse colonic diverticulosis without findings for acute
diverticulitis. Ill-defined right-sided rectal wall thickening
consistent with patient's known rectal cancer. No obvious perirectal
involvement. No perirectal adenopathy or pelvic sidewall adenopathy
the. No enlarged nodes in the sigmoid mesocolon.

Vascular/Lymphatic: Advanced atherosclerotic calcifications
involving the aorta iliac arteries but no aneurysm or dissection.
The branch vessels are patent. The major venous structures are
patent. No mesenteric or retroperitoneal lymphadenopathy.

Small medial and lateral external iliac artery lymph nodes are
noted.

7 mm lymph node on image 99/3.

5 mm left-sided node on image 99/3.

5 mm right lateral external iliac node on image 98/3.

11 mm right common femoral node on image 104/3.

Left lateral external iliac node on image 100/3.

13 mm right inguinal lymph node on image number 111/3.

Reproductive: The uterus is surgically absent. Both ovaries are
still present and appear normal.

Other: No free pelvic fluid collections. Small periumbilical
abdominal wall hernia containing fat.

Musculoskeletal: No significant bony findings.
IMPRESSION: 1. Ill-defined right-sided rectal mass correlating with the
patient's known rectal cancer. No perirectal adenopathy or sigmoid
mesocolon adenopathy.
2. Small/borderline iliac lymph nodes bilaterally as detailed above.
There is also a 13 mm right inguinal lymph node.
3. No findings for hepatic metastatic disease and no retroperitoneal
adenopathy.
4. No CT findings for pulmonary metastatic disease.
5. Four indeterminate renal lesions, most likely hyperdense cysts
but will require further evaluation. Recommend MRI abdomen without
and with contrast when able.
6. Colonic diverticulosis but no other colonic lesions are
identified.
7. Multinodular thyroid goiter.

## 2022-01-25 DEATH — deceased
